# Patient Record
Sex: Male | Born: 1980 | Race: White | Hispanic: No | Marital: Married | Smoking: Never smoker
Health system: Southern US, Community
[De-identification: ages and names within clinical notes are randomized; demographics above are authoritative.]

## PROBLEM LIST (undated history)

## (undated) DIAGNOSIS — M5416 Radiculopathy, lumbar region: Secondary | ICD-10-CM

## (undated) DIAGNOSIS — Z87442 Personal history of urinary calculi: Secondary | ICD-10-CM

## (undated) DIAGNOSIS — I10 Essential (primary) hypertension: Secondary | ICD-10-CM

## (undated) DIAGNOSIS — F419 Anxiety disorder, unspecified: Secondary | ICD-10-CM

## (undated) DIAGNOSIS — M5116 Intervertebral disc disorders with radiculopathy, lumbar region: Secondary | ICD-10-CM

---

## 2004-10-30 ENCOUNTER — Emergency Department: Payer: Self-pay | Admitting: Emergency Medicine

## 2008-12-09 ENCOUNTER — Emergency Department: Payer: Self-pay | Admitting: Emergency Medicine

## 2010-09-05 ENCOUNTER — Emergency Department: Payer: Self-pay | Admitting: Emergency Medicine

## 2011-08-15 ENCOUNTER — Ambulatory Visit: Payer: Self-pay | Admitting: Internal Medicine

## 2012-05-21 ENCOUNTER — Ambulatory Visit: Payer: Self-pay | Admitting: Family Medicine

## 2012-05-21 LAB — DOT URINE DIP
Blood: NEGATIVE
Glucose,UR: NEGATIVE mg/dL (ref 0–75)
Protein: NEGATIVE
Specific Gravity: 1.015 (ref 1.003–1.030)

## 2012-10-14 ENCOUNTER — Emergency Department: Payer: Self-pay | Admitting: Emergency Medicine

## 2012-10-14 LAB — URINALYSIS, COMPLETE
Bacteria: NONE SEEN
Bilirubin,UR: NEGATIVE
Blood: NEGATIVE
Glucose,UR: NEGATIVE mg/dL (ref 0–75)
Ketone: NEGATIVE
Leukocyte Esterase: NEGATIVE
Nitrite: NEGATIVE
Ph: 6 (ref 4.5–8.0)
Protein: NEGATIVE
RBC,UR: NONE SEEN /HPF (ref 0–5)
Specific Gravity: 1.016 (ref 1.003–1.030)
Squamous Epithelial: NONE SEEN
WBC UR: <1 /HPF (ref 0–5)

## 2012-10-14 LAB — COMPREHENSIVE METABOLIC PANEL
Albumin: 4.1 g/dL (ref 3.4–5.0)
Alkaline Phosphatase: 77 U/L (ref 50–136)
Anion Gap: 1 — ABNORMAL LOW (ref 7–16)
BUN: 11 mg/dL (ref 7–18)
Bilirubin,Total: 0.4 mg/dL (ref 0.2–1.0)
Calcium, Total: 9 mg/dL (ref 8.5–10.1)
Chloride: 107 mmol/L (ref 98–107)
Co2: 30 mmol/L (ref 21–32)
Creatinine: 1.02 mg/dL (ref 0.60–1.30)
EGFR (African American): >60
EGFR (Non-African Amer.): >60
Glucose: 92 mg/dL (ref 65–99)
Osmolality: 275 (ref 275–301)
Potassium: 4 mmol/L (ref 3.5–5.1)
SGOT(AST): 77 U/L — ABNORMAL HIGH (ref 15–37)
SGPT (ALT): 152 U/L — ABNORMAL HIGH (ref 12–78)
Sodium: 138 mmol/L (ref 136–145)
Total Protein: 7.7 g/dL (ref 6.4–8.2)

## 2012-10-14 LAB — CBC
HCT: 43.5 % (ref 40.0–52.0)
HGB: 15.1 g/dL (ref 13.0–18.0)
MCH: 27.9 pg (ref 26.0–34.0)
MCHC: 34.7 g/dL (ref 32.0–36.0)
MCV: 81 fL (ref 80–100)
Platelet: 231 10*3/uL (ref 150–440)
RBC: 5.4 10*6/uL (ref 4.40–5.90)
RDW: 14.4 % (ref 11.5–14.5)
WBC: 7.6 10*3/uL (ref 3.8–10.6)

## 2012-10-14 LAB — SEDIMENTATION RATE: Erythrocyte Sed Rate: 1 mm/hr (ref 0–15)

## 2016-03-23 ENCOUNTER — Ambulatory Visit: Payer: 59 | Attending: Internal Medicine

## 2016-03-23 DIAGNOSIS — G4733 Obstructive sleep apnea (adult) (pediatric): Secondary | ICD-10-CM | POA: Insufficient documentation

## 2016-03-24 DIAGNOSIS — I10 Essential (primary) hypertension: Secondary | ICD-10-CM | POA: Diagnosis not present

## 2016-03-24 DIAGNOSIS — R7989 Other specified abnormal findings of blood chemistry: Secondary | ICD-10-CM | POA: Diagnosis not present

## 2016-04-10 DIAGNOSIS — R945 Abnormal results of liver function studies: Secondary | ICD-10-CM | POA: Diagnosis not present

## 2016-05-23 DIAGNOSIS — J042 Acute laryngotracheitis: Secondary | ICD-10-CM | POA: Diagnosis not present

## 2016-05-25 DIAGNOSIS — G4733 Obstructive sleep apnea (adult) (pediatric): Secondary | ICD-10-CM | POA: Diagnosis not present

## 2016-06-25 DIAGNOSIS — G4733 Obstructive sleep apnea (adult) (pediatric): Secondary | ICD-10-CM | POA: Diagnosis not present

## 2016-07-03 DIAGNOSIS — G4733 Obstructive sleep apnea (adult) (pediatric): Secondary | ICD-10-CM | POA: Diagnosis not present

## 2016-07-25 DIAGNOSIS — G4733 Obstructive sleep apnea (adult) (pediatric): Secondary | ICD-10-CM | POA: Diagnosis not present

## 2016-08-17 ENCOUNTER — Encounter: Payer: Self-pay | Admitting: Emergency Medicine

## 2016-08-17 ENCOUNTER — Emergency Department: Payer: 59

## 2016-08-17 ENCOUNTER — Emergency Department
Admission: EM | Admit: 2016-08-17 | Discharge: 2016-08-17 | Disposition: A | Discharge status: Other Acute Inpatient Hospital | Payer: 59 | Attending: Emergency Medicine | Admitting: Emergency Medicine

## 2016-08-17 DIAGNOSIS — R42 Dizziness and giddiness: Secondary | ICD-10-CM | POA: Diagnosis not present

## 2016-08-17 DIAGNOSIS — I671 Cerebral aneurysm, nonruptured: Secondary | ICD-10-CM | POA: Diagnosis not present

## 2016-08-17 DIAGNOSIS — I10 Essential (primary) hypertension: Secondary | ICD-10-CM | POA: Insufficient documentation

## 2016-08-17 DIAGNOSIS — Z79899 Other long term (current) drug therapy: Secondary | ICD-10-CM | POA: Diagnosis not present

## 2016-08-17 DIAGNOSIS — I729 Aneurysm of unspecified site: Secondary | ICD-10-CM | POA: Diagnosis not present

## 2016-08-17 DIAGNOSIS — Z5181 Encounter for therapeutic drug level monitoring: Secondary | ICD-10-CM | POA: Diagnosis not present

## 2016-08-17 DIAGNOSIS — H538 Other visual disturbances: Secondary | ICD-10-CM | POA: Diagnosis not present

## 2016-08-17 DIAGNOSIS — Z9114 Patient's other noncompliance with medication regimen: Secondary | ICD-10-CM | POA: Diagnosis not present

## 2016-08-17 DIAGNOSIS — R51 Headache: Secondary | ICD-10-CM | POA: Diagnosis not present

## 2016-08-17 DIAGNOSIS — R9431 Abnormal electrocardiogram [ECG] [EKG]: Secondary | ICD-10-CM | POA: Diagnosis not present

## 2016-08-17 DIAGNOSIS — R110 Nausea: Secondary | ICD-10-CM | POA: Diagnosis not present

## 2016-08-17 HISTORY — DX: Essential (primary) hypertension: I10

## 2016-08-17 HISTORY — DX: Anxiety disorder, unspecified: F41.9

## 2016-08-17 LAB — COMPREHENSIVE METABOLIC PANEL
ALT: 55 U/L (ref 17–63)
AST: 35 U/L (ref 15–41)
Albumin: 4.5 g/dL (ref 3.5–5.0)
Alkaline Phosphatase: 79 U/L (ref 38–126)
Anion gap: 7 (ref 5–15)
BUN: 13 mg/dL (ref 6–20)
CO2: 27 mmol/L (ref 22–32)
Calcium: 9 mg/dL (ref 8.9–10.3)
Chloride: 105 mmol/L (ref 101–111)
Creatinine, Ser: 0.98 mg/dL (ref 0.61–1.24)
GFR calc Af Amer: >60 mL/min (ref >60)
GFR calc non Af Amer: >60 mL/min (ref >60)
Glucose, Bld: 133 mg/dL — ABNORMAL HIGH (ref 65–99)
Potassium: 3.1 mmol/L — ABNORMAL LOW (ref 3.5–5.1)
Sodium: 139 mmol/L (ref 135–145)
Total Bilirubin: 0.5 mg/dL (ref 0.3–1.2)
Total Protein: 7.8 g/dL (ref 6.5–8.1)

## 2016-08-17 LAB — URINE DRUG SCREEN, QUALITATIVE (ARMC ONLY)
Amphetamines, Ur Screen: NOT DETECTED
Barbiturates, Ur Screen: NOT DETECTED
Benzodiazepine, Ur Scrn: NOT DETECTED
Cannabinoid 50 Ng, Ur ~~LOC~~: NOT DETECTED
Cocaine Metabolite,Ur ~~LOC~~: NOT DETECTED
MDMA (Ecstasy)Ur Screen: NOT DETECTED
Methadone Scn, Ur: NOT DETECTED
Opiate, Ur Screen: NOT DETECTED
Phencyclidine (PCP) Ur S: NOT DETECTED
Tricyclic, Ur Screen: NOT DETECTED

## 2016-08-17 LAB — CBC
HCT: 42.9 % (ref 40.0–52.0)
Hemoglobin: 14.9 g/dL (ref 13.0–18.0)
MCH: 27.9 pg (ref 26.0–34.0)
MCHC: 34.8 g/dL (ref 32.0–36.0)
MCV: 80.2 fL (ref 80.0–100.0)
Platelets: 262 10*3/uL (ref 150–440)
RBC: 5.35 MIL/uL (ref 4.40–5.90)
RDW: 14 % (ref 11.5–14.5)
WBC: 9.7 10*3/uL (ref 3.8–10.6)

## 2016-08-17 LAB — URINALYSIS, COMPLETE (UACMP) WITH MICROSCOPIC
Bilirubin Urine: NEGATIVE
Glucose, UA: NEGATIVE mg/dL
Hgb urine dipstick: NEGATIVE
Ketones, ur: NEGATIVE mg/dL
Leukocytes, UA: NEGATIVE
Nitrite: NEGATIVE
Protein, ur: NEGATIVE mg/dL
Specific Gravity, Urine: 1.02 (ref 1.005–1.030)
Squamous Epithelial / LPF: NONE SEEN
WBC, UA: NONE SEEN WBC/hpf (ref 0–5)
pH: 7 (ref 5.0–8.0)

## 2016-08-17 LAB — TROPONIN I: Troponin I: <0.03 ng/mL (ref <0.03)

## 2016-08-17 LAB — LIPASE, BLOOD: Lipase: 27 U/L (ref 11–51)

## 2016-08-17 MED ORDER — MORPHINE SULFATE (PF) 4 MG/ML IV SOLN
4.0000 mg | Freq: Once | INTRAVENOUS | Status: AC
  Administered 2016-08-17: 4 mg via INTRAVENOUS

## 2016-08-17 MED ORDER — ONDANSETRON HCL 4 MG/2ML IJ SOLN
INTRAMUSCULAR | Status: AC
  Administered 2016-08-17: 4 mg via INTRAVENOUS
  Filled 2016-08-17: qty 2

## 2016-08-17 MED ORDER — IOPAMIDOL (ISOVUE-370) INJECTION 76%
75.0000 mL | Freq: Once | INTRAVENOUS | Status: AC | PRN
Start: 2016-08-17 — End: 2016-08-17
  Administered 2016-08-17: 75 mL via INTRAVENOUS

## 2016-08-17 MED ORDER — ONDANSETRON HCL 4 MG/2ML IJ SOLN
4.0000 mg | Freq: Once | INTRAMUSCULAR | Status: AC
  Administered 2016-08-17: 4 mg via INTRAVENOUS

## 2016-08-17 MED ORDER — MORPHINE SULFATE (PF) 4 MG/ML IV SOLN
INTRAVENOUS | Status: AC
  Filled 2016-08-17: qty 1

## 2016-08-17 MED ORDER — LORAZEPAM 2 MG/ML IJ SOLN
1.0000 mg | Freq: Once | INTRAMUSCULAR | Status: AC
  Administered 2016-08-17: 1 mg via INTRAVENOUS

## 2016-08-17 MED ORDER — ONDANSETRON HCL 4 MG/2ML IJ SOLN
INTRAMUSCULAR | Status: AC
  Filled 2016-08-17: qty 2

## 2016-08-17 MED ORDER — DIPHENHYDRAMINE HCL 50 MG/ML IJ SOLN
INTRAMUSCULAR | Status: AC
  Filled 2016-08-17: qty 1

## 2016-08-17 MED ORDER — LORAZEPAM 2 MG/ML IJ SOLN
INTRAMUSCULAR | Status: AC
  Administered 2016-08-17: 1 mg via INTRAVENOUS
  Filled 2016-08-17: qty 1

## 2016-08-17 NOTE — ED Provider Notes (Signed)
[facility] Emergency Department Provider Note    First MD Initiated Contact with Patient 08/17/16 [phone]     (approximate)  I have reviewed the triage vital signs and the nursing notes.   HISTORY  Chief Complaint Migraine    HPI George Richards is a 36 y.o. male with history of hypertension, anxiety and migraine headaches presents with acute onset of occipital headache with blurred vision and dizziness tonight while having sexual intercourse. Patient states that pain sharp 10 out of 10 radiating from the occiput to the frontal portion of his head. Patient denied any weakness numbness or gait instability. Patient does however admit to nausea and vomiting. Patient states similar episode occurred last week during defecation. Patient states last migraine headache was approximately 8 years ago and that this headache is remarkably different from any previous headaches. Patient states his current pain score is still 10 out of 10. Patient states blurred vision still persists along with dizziness and nausea.   Past Medical History:  Diagnosis Date  . Anxiety   . Hypertension     There are no active problems to display for this patient.   Past surgical history None  Prior to Admission medications   Not on File    Allergies No known drug allergies  Family History None  Social History Social History  Substance Use Topics  . Smoking status: Never Smoker  . Smokeless tobacco: Never Used  . Alcohol use No    Review of Systems Constitutional: No fever/chills Eyes: No visual changes. ENT: No sore throat. Cardiovascular: Denies chest pain. Respiratory: Denies shortness of breath. Gastrointestinal: No abdominal pain. Positive for nausea and vomiting .  No diarrhea.  No constipation. Genitourinary: Negative for dysuria. Musculoskeletal: Negative for neck pain.  Negative for back pain. Integumentary: Negative for rash. Neurological: Positive for headaches,  Negative for focal weakness or numbness.   ____________________________________________   PHYSICAL EXAM:  VITAL SIGNS: ED Triage Vitals  Enc Vitals Group     BP 08/17/16 0118 (!) 169/118     Pulse Rate 08/17/16 0118 80     Resp 08/17/16 0118 18     Temp 08/17/16 0118 98.1 F (36.7 C)     Temp Source 08/17/16 0118 Oral     SpO2 08/17/16 0118 98 %     Weight 08/17/16 0119 122.5 kg (270 lb)     Height 08/17/16 0119 1.778 m (5\' 10" )     Head Circumference --      Peak Flow --      Pain Score 08/17/16 0118 10     Pain Loc --      Pain Edu? --      Excl. in GC? --     Constitutional: Alert and oriented. Apparent discomfort Eyes: Conjunctivae are normal. PERRL. EOMI. Head: Atraumatic. Nose: No congestion/rhinnorhea. Mouth/Throat: Mucous membranes are moist. Oropharynx non-erythematous. Neck: No stridor.   Cardiovascular: Normal rate, regular rhythm. Good peripheral circulation. Grossly normal heart sounds. Respiratory: Normal respiratory effort.  No retractions. Lungs CTAB. Gastrointestinal: Soft and nontender. No distention.  Musculoskeletal: No lower extremity tenderness nor edema. No gross deformities of extremities. Neurologic:  Normal speech and language. No gross focal neurologic deficits are appreciated.  Skin:  Skin is warm, dry and intact. No rash noted. Psychiatric: Mood and affect are normal. Speech and behavior are normal.  ____________________________________________   LABS (all labs ordered are listed, but only abnormal results are displayed)  Labs Reviewed  COMPREHENSIVE METABOLIC PANEL -  Abnormal; Notable for the following:       Result Value   Potassium 3.1 (*)    Glucose, Bld 133 (*)    All other components within normal limits  URINALYSIS, COMPLETE (UACMP) WITH MICROSCOPIC - Abnormal; Notable for the following:    Bacteria, UA RARE (*)    All other components within normal limits  LIPASE, BLOOD  CBC  TROPONIN I  URINE DRUG SCREEN, QUALITATIVE  ([facility] ONLY)     RADIOLOGY I, [place] BROWN, personally viewed and evaluated these images (plain radiographs) as part of my medical decision making, as well as reviewing the written report by the radiologist.  Ct Angio Head W Or Wo Contrast  Result Date: 08/17/2016 CLINICAL DATA:  Initial evaluation for acute onset headache EXAM: CT ANGIOGRAPHY HEAD AND NECK TECHNIQUE: Multidetector CT imaging of the head and neck was performed using the standard protocol during bolus administration of intravenous contrast. Multiplanar CT image reconstructions and MIPs were obtained to evaluate the vascular anatomy. Carotid stenosis measurements (when applicable) are obtained utilizing NASCET criteria, using the distal internal carotid diameter as the denominator. CONTRAST:  75 cc of Isovue 370. COMPARISON:  Prior CT priors prior CT from earlier the same day. FINDINGS: CTA NECK FINDINGS Aortic arch: Visualized aortic arch of normal caliber with normal branch pattern. No flow-limiting stenosis about the origin the great vessels. Left subclavian artery tortuous proximally. Subclavian artery is widely patent. Right carotid system: Right common and internal carotid arteries are widely patent without stenosis, dissection, or occlusion. No significant atheromatous narrowing about the right carotid bifurcation. Left carotid system: Left common and internal carotid arteries are widely patent without stenosis, dissection, or occlusion. No significant atheromatous narrowing about the left carotid bifurcation. Vertebral arteries: Both of the vertebral arteries arise from the subclavian arteries. Right vertebral artery dominant. Left vertebral artery diffusely hypoplastic. Vertebral arteries widely patent without stenosis, dissection, or occlusion. Skeleton: No acute osseus abnormality. No worrisome lytic or blastic osseous lesions. Other neck: No acute soft tissue abnormality within the neck. Salivary glands normal. Thyroid normal.  No adenopathy. Upper chest: Visualized upper chest within normal limits. Partially visualized lungs are clear. Review of the MIP images confirms the above findings CTA HEAD FINDINGS Anterior circulation: Petrous, cavernous, supraclinoid segments of the internal carotid arteries are widely patent without stenosis. ICA termini widely patent. A1 segments patent bilaterally. Anterior communicating artery normal. Anterior cerebral artery is widely patent to their distal aspects. M1 segments widely patent without stenosis or occlusion. MCA bifurcations normal. No proximal M2 occlusion. Distal MCA branches well opacified and symmetric. Posterior circulation: Dominant right vertebral artery widely patent to the vertebrobasilar junction. Left vertebral artery diffusely hypoplastic and patent as well. Posterior inferior cerebral arteries patent bilaterally. Basilar artery widely patent. Superior cerebellar is patent bilaterally. PCAs supplied via the basilar artery bilaterally. Right PCA normal in appearance and well supply to its distal aspect. There is fusiform dilatation of the left P1 segment up to 3 mm (series 9, image 109). Left PCA widely patent distally and normal in appearance. Venous sinuses: Patent. Anatomic variants: No other significant anatomic variant. No other aneurysm or vascular malformation. Delayed phase: No pathologic enhancement. Review of the MIP images confirms the above findings IMPRESSION: 1. Fusiform dilatation of the left P1 segment up to 3 mm. While it is unclear whether this finding is of clinical significance, neuro endovascular consultation is suggested for possible consideration of follow-up catheter directed arteriogram and/or need for follow-up imaging regarding this finding. 2.  Otherwise normal CTA of the head and neck. Electronically Signed   By: Rise MuBenjamin  McClintock M.D.   On: 08/17/2016 05:45   Ct Head Wo Contrast  Result Date: 08/17/2016 CLINICAL DATA:  Initial evaluation for acute  headache, blurry vision. EXAM: CT HEAD WITHOUT CONTRAST TECHNIQUE: Contiguous axial images were obtained from the base of the skull through the vertex without intravenous contrast. COMPARISON:  None. FINDINGS: Brain: Cerebral volume within normal limits for patient age. No evidence for acute intracranial hemorrhage. No findings to suggest acute large vessel territory infarct. No mass lesion, midline shift, or mass effect. Ventricles are normal in size without evidence for hydrocephalus. No extra-axial fluid collection identified. Vascular: No hyperdense vessel identified. Skull: Scalp soft tissues demonstrate no acute abnormality.Calvarium intact. Sinuses/Orbits: Globes and orbital soft tissues are within normal limits. Visualized paranasal sinuses are clear. No mastoid effusion. IMPRESSION: Normal head CT.  No acute intracranial process identified. Electronically Signed   By: Rise MuBenjamin  McClintock M.D.   On: 08/17/2016 02:01   Ct Angio Neck W And/or Wo Contrast  Result Date: 08/17/2016 CLINICAL DATA:  Initial evaluation for acute onset headache EXAM: CT ANGIOGRAPHY HEAD AND NECK TECHNIQUE: Multidetector CT imaging of the head and neck was performed using the standard protocol during bolus administration of intravenous contrast. Multiplanar CT image reconstructions and MIPs were obtained to evaluate the vascular anatomy. Carotid stenosis measurements (when applicable) are obtained utilizing NASCET criteria, using the distal internal carotid diameter as the denominator. CONTRAST:  75 cc of Isovue 370. COMPARISON:  Prior CT priors prior CT from earlier the same day. FINDINGS: CTA NECK FINDINGS Aortic arch: Visualized aortic arch of normal caliber with normal branch pattern. No flow-limiting stenosis about the origin the great vessels. Left subclavian artery tortuous proximally. Subclavian artery is widely patent. Right carotid system: Right common and internal carotid arteries are widely patent without stenosis,  dissection, or occlusion. No significant atheromatous narrowing about the right carotid bifurcation. Left carotid system: Left common and internal carotid arteries are widely patent without stenosis, dissection, or occlusion. No significant atheromatous narrowing about the left carotid bifurcation. Vertebral arteries: Both of the vertebral arteries arise from the subclavian arteries. Right vertebral artery dominant. Left vertebral artery diffusely hypoplastic. Vertebral arteries widely patent without stenosis, dissection, or occlusion. Skeleton: No acute osseus abnormality. No worrisome lytic or blastic osseous lesions. Other neck: No acute soft tissue abnormality within the neck. Salivary glands normal. Thyroid normal. No adenopathy. Upper chest: Visualized upper chest within normal limits. Partially visualized lungs are clear. Review of the MIP images confirms the above findings CTA HEAD FINDINGS Anterior circulation: Petrous, cavernous, supraclinoid segments of the internal carotid arteries are widely patent without stenosis. ICA termini widely patent. A1 segments patent bilaterally. Anterior communicating artery normal. Anterior cerebral artery is widely patent to their distal aspects. M1 segments widely patent without stenosis or occlusion. MCA bifurcations normal. No proximal M2 occlusion. Distal MCA branches well opacified and symmetric. Posterior circulation: Dominant right vertebral artery widely patent to the vertebrobasilar junction. Left vertebral artery diffusely hypoplastic and patent as well. Posterior inferior cerebral arteries patent bilaterally. Basilar artery widely patent. Superior cerebellar is patent bilaterally. PCAs supplied via the basilar artery bilaterally. Right PCA normal in appearance and well supply to its distal aspect. There is fusiform dilatation of the left P1 segment up to 3 mm (series 9, image 109). Left PCA widely patent distally and normal in appearance. Venous sinuses: Patent.  Anatomic variants: No other significant anatomic variant. No other aneurysm or  vascular malformation. Delayed phase: No pathologic enhancement. Review of the MIP images confirms the above findings IMPRESSION: 1. Fusiform dilatation of the left P1 segment up to 3 mm. While it is unclear whether this finding is of clinical significance, neuro endovascular consultation is suggested for possible consideration of follow-up catheter directed arteriogram and/or need for follow-up imaging regarding this finding. 2. Otherwise normal CTA of the head and neck. Electronically Signed   By: Rise Mu M.D.   On: 08/17/2016 05:45    ____________________________________________   PROCEDURES  Critical Care performed: CRITICAL CARE Performed by: Darci Current   Total critical care time: 30 minutes  Critical care time was exclusive of separately billable procedures and treating other patients.  Critical care was necessary to treat or prevent imminent or life-threatening deterioration.  Critical care was time spent personally by me on the following activities: development of treatment plan with patient and/or surrogate as well as nursing, discussions with consultants, evaluation of patient's response to treatment, examination of patient, obtaining history from patient or surrogate, ordering and performing treatments and interventions, ordering and review of laboratory studies, ordering and review of radiographic studies, pulse oximetry and re-evaluation of patient's condition.   Procedures   ____________________________________________   INITIAL IMPRESSION / ASSESSMENT AND PLAN / ED COURSE  Pertinent labs & imaging results that were available during my care of the patient were reviewed by me and considered in my medical decision making (see chart for details).  36 year old male presenting with abrupt onset of headache during intercourse and last week during defecation. CT angiogram  concerning for fusiform dilation of the left P1 segment. Given this finding patient discussed with Dr. Darlen Round neurosurgeon on call who will accept the patient in transfer to [facility] emergency department for further evaluation and management.  Patient was given IV morphine following evaluation with improvement of pain however pain returned. Patient admits that he has a long-standing history of anxiety and admits to markedly anxiety after being notified of CTA findings. As such patient received 1 mg of IV Ativan    ____________________________________________  FINAL CLINICAL IMPRESSION(S) / ED DIAGNOSES  Final diagnoses:  Intracerebral aneurysm     MEDICATIONS GIVEN DURING THIS VISIT:  Medications  diphenhydrAMINE (BENADRYL) 50 MG/ML injection (not administered)  LORazepam (ATIVAN) injection 1 mg (not administered)  ondansetron (ZOFRAN) injection 4 mg (not administered)  morphine 4 MG/ML injection 4 mg (4 mg Intravenous Given 08/17/16 0414)  ondansetron (ZOFRAN) injection 4 mg (4 mg Intravenous Given 08/17/16 0412)  iopamidol (ISOVUE-370) 76 % injection 75 mL (75 mLs Intravenous Contrast Given 08/17/16 0427)     NEW OUTPATIENT MEDICATIONS STARTED DURING THIS VISIT:  New Prescriptions   No medications on file    Modified Medications   No medications on file    Discontinued Medications   No medications on file     Note:  This document was prepared using Dragon voice recognition software and may include unintentional dictation errors.    Darci Current, MD 08/17/16 [phone]

## 2016-08-17 NOTE — ED Notes (Signed)
Patient transported to CT 

## 2016-08-17 NOTE — ED Triage Notes (Addendum)
Pt ambulatory to triage room with steady gait c/o migraine, dizziness, nausea and vomiting x 1 week. Pt states migraine accompanied by blurry vision. Pt denies abd pain, chest pain, shortness of breath. Pt anxious during triage, requesting to be placed on O2 while EKG is being performed. Pt placed on 2 L [place] for comfort. Pt diaphoretic in triage. Pt has hx of HTN, migraines and anxiety, states took BP med PTA.

## 2016-08-17 NOTE — ED Notes (Signed)
This RN spoke with MD for further orders for patient. Orders placed by MD.

## 2016-08-25 DIAGNOSIS — G4733 Obstructive sleep apnea (adult) (pediatric): Secondary | ICD-10-CM | POA: Diagnosis not present

## 2016-09-06 DIAGNOSIS — Z9989 Dependence on other enabling machines and devices: Secondary | ICD-10-CM | POA: Diagnosis not present

## 2016-09-06 DIAGNOSIS — G4733 Obstructive sleep apnea (adult) (pediatric): Secondary | ICD-10-CM | POA: Diagnosis not present

## 2016-09-06 DIAGNOSIS — Z0001 Encounter for general adult medical examination with abnormal findings: Secondary | ICD-10-CM | POA: Diagnosis not present

## 2016-09-06 DIAGNOSIS — I10 Essential (primary) hypertension: Secondary | ICD-10-CM | POA: Diagnosis not present

## 2016-12-14 ENCOUNTER — Emergency Department
Admission: EM | Admit: 2016-12-14 | Discharge: 2016-12-14 | Disposition: A | Discharge status: Home / Self Care | Payer: 59 | Attending: Emergency Medicine | Admitting: Emergency Medicine

## 2016-12-14 DIAGNOSIS — S0591XA Unspecified injury of right eye and orbit, initial encounter: Secondary | ICD-10-CM | POA: Diagnosis present

## 2016-12-14 DIAGNOSIS — X58XXXA Exposure to other specified factors, initial encounter: Secondary | ICD-10-CM | POA: Insufficient documentation

## 2016-12-14 DIAGNOSIS — Y929 Unspecified place or not applicable: Secondary | ICD-10-CM | POA: Diagnosis not present

## 2016-12-14 DIAGNOSIS — I10 Essential (primary) hypertension: Secondary | ICD-10-CM | POA: Insufficient documentation

## 2016-12-14 DIAGNOSIS — Y939 Activity, unspecified: Secondary | ICD-10-CM | POA: Insufficient documentation

## 2016-12-14 DIAGNOSIS — Y999 Unspecified external cause status: Secondary | ICD-10-CM | POA: Insufficient documentation

## 2016-12-14 DIAGNOSIS — S0501XA Injury of conjunctiva and corneal abrasion without foreign body, right eye, initial encounter: Secondary | ICD-10-CM | POA: Diagnosis not present

## 2016-12-14 MED ORDER — POLYMYXIN B-TRIMETHOPRIM 10000-0.1 UNIT/ML-% OP SOLN: 2.0000 [drp] | Freq: Four times a day (QID) | OPHTHALMIC | 0 refills | Status: DC

## 2016-12-14 MED ORDER — TETRACAINE HCL 0.5 % OP SOLN
2.0000 [drp] | Freq: Once | OPHTHALMIC | Status: AC
  Administered 2016-12-14: 2 [drp] via OPHTHALMIC
  Filled 2016-12-14: qty 4

## 2016-12-14 MED ORDER — KETOROLAC TROMETHAMINE 0.5 % OP SOLN: 1.0000 [drp] | Freq: Four times a day (QID) | OPHTHALMIC | 0 refills | Status: DC

## 2016-12-14 MED ORDER — FLUORESCEIN SODIUM 1 MG OP STRP
1.0000 | ORAL_STRIP | Freq: Once | OPHTHALMIC | Status: AC
  Administered 2016-12-14: 1 via OPHTHALMIC
  Filled 2016-12-14: qty 1

## 2016-12-14 NOTE — ED Triage Notes (Signed)
Patient reports he believes he got metal in his right eye while working in the garage 2 hours ago. Patient c/o eye pain and blurred vision.

## 2016-12-14 NOTE — ED Provider Notes (Signed)
[facility] Emergency Department Provider Note  ____________________________________________  Time seen: Approximately 11:19 PM  I have reviewed the triage vital signs and the nursing notes.   HISTORY  Chief Complaint Eye Injury (right)    HPI George Richards is a 36 y.o. male who presents emergency department complaining of foreign body to the right eye. Patient reports it is working on a vehicle when he felt a piece of metal fly into his eye. Patient continues to have a foreign body sensation. His eyes erythematous and swollen. Patient has had continual clear drainage from his eye. No other injury or complaint. No medications prior to arrival. Patient does not wear glasses or contacts.   Past Medical History:  Diagnosis Date  . Anxiety   . Hypertension     There are no active problems to display for this patient.   History reviewed. No pertinent surgical history.  Prior to Admission medications   Medication Sig Start Date End Date Taking? Authorizing Provider  ketorolac (ACULAR) 0.5 % ophthalmic solution Place 1 drop into the right eye 4 (four) times daily. 12/14/16   Briselda Naval, Delorise Royals, PA-C  trimethoprim-polymyxin b (POLYTRIM) ophthalmic solution Place 2 drops into the right eye every 6 (six) hours. 12/14/16   Zayvian Mcmurtry, Delorise Royals, PA-C    Allergies Patient has no known allergies.  No family history on file.  Social History Social History  Substance Use Topics  . Smoking status: Never Smoker  . Smokeless tobacco: Never Used  . Alcohol use No     Review of Systems  Constitutional: No fever/chills Eyes: No visual changes. No discharge. Positive for foreign body sensation to the R eye ENT: No upper respiratory complaints. Cardiovascular: no chest pain. Respiratory: no cough. No SOB. Gastrointestinal: No abdominal pain.  No nausea, no vomiting.  Musculoskeletal: Negative for musculoskeletal pain. Skin: Negative for rash, abrasions,  lacerations, ecchymosis. Neurological: Negative for headaches, focal weakness or numbness. 10-point ROS otherwise negative.  ____________________________________________   PHYSICAL EXAM:  VITAL SIGNS: ED Triage Vitals  Enc Vitals Group     BP 12/14/16 2131 (!) 145/98     Pulse Rate 12/14/16 2131 90     Resp 12/14/16 2131 18     Temp 12/14/16 2131 98.7 F (37.1 C)     Temp Source 12/14/16 2131 Oral     SpO2 12/14/16 2131 97 %     Weight 12/14/16 2128 265 lb (120.2 kg)     Height 12/14/16 2128 5\' 10"  (1.778 m)     Head Circumference --      Peak Flow --      Pain Score 12/14/16 2128 8     Pain Loc --      Pain Edu? --      Excl. in GC? --      Constitutional: Alert and oriented. Well appearing and in no acute distress. Eyes: conjunctiva on right is erythematous. PERRL. EOMI.unduscopic exam reveals no visible foreign body, red reflex, vasculature, optic disc unremarkable. Eyes anesthetized using tetracaine. Fluorescein staining is applied with area of uptake in the 7:00 position over the iris. No foreign body. Head: Atraumatic. ENT:      Ears:       Nose: No congestion/rhinnorhea.      Mouth/Throat: Mucous membranes are moist.  Neck: No stridor.    Cardiovascular: Normal rate, regular rhythm. Normal S1 and S2.  Good peripheral circulation. Respiratory: Normal respiratory effort without tachypnea or retractions. Lungs CTAB. Good air entry to the  bases with no decreased or absent breath sounds. Musculoskeletal: Full range of motion to all extremities. No gross deformities appreciated. Neurologic:  Normal speech and language. No gross focal neurologic deficits are appreciated.  Skin:  Skin is warm, dry and intact. No rash noted. Psychiatric: Mood and affect are normal. Speech and behavior are normal. Patient exhibits appropriate insight and judgement.   ____________________________________________   LABS (all labs ordered are listed, but only abnormal results are  displayed)  Labs Reviewed - No data to display ____________________________________________  EKG   ____________________________________________  RADIOLOGY   No results found.  ____________________________________________    PROCEDURES  Procedure(s) performed:    Procedures    Medications  tetracaine (PONTOCAINE) 0.5 % ophthalmic solution 2 drop (2 drops Right Eye Given 12/14/16 2344)  fluorescein ophthalmic strip 1 strip (1 strip Right Eye Given 12/14/16 2344)     ____________________________________________   INITIAL IMPRESSION / ASSESSMENT AND PLAN / ED COURSE  Pertinent labs & imaging results that were available during my care of the patient were reviewed by me and considered in my medical decision making (see chart for details).  Review of the [place] CSRS was performed in accordance of the [facility] prior to dispensing any controlled drugs.     Patient's diagnosis is consistent with corneal abrasion. Differential included foreign body, corneal abrasion, globe rupture, conjunctivitis. At this time, no evidence of remaining foreign body. Punctate lesion over the iris is consistent with injury from foreign body,however no residual.. Patient will be discharged home with prescriptions for Polytrim eye drops and Acular for symptom control. Patient is to follow up with ophthalmology as needed or otherwise directed. Patient is given ED precautions to return to the ED for any worsening or new symptoms.     ____________________________________________  FINAL CLINICAL IMPRESSION(S) / ED DIAGNOSES  Final diagnoses:  Abrasion of right cornea, initial encounter      NEW MEDICATIONS STARTED DURING THIS VISIT:  Discharge Medication List as of 12/14/2016 11:43 PM    START taking these medications   Details  ketorolac (ACULAR) 0.5 % ophthalmic solution Place 1 drop into the right eye 4 (four) times daily., Starting Thu 12/14/2016, Print    trimethoprim-polymyxin b  (POLYTRIM) ophthalmic solution Place 2 drops into the right eye every 6 (six) hours., Starting Thu 12/14/2016, Print            This chart was dictated using voice recognition software/Dragon. Despite best efforts to proofread, errors can occur which can change the meaning. Any change was purely unintentional.    Racheal PatchesCuthriell, Tyannah Sane D, PA-C 12/15/16 0012    Jeanmarie PlantMcShane, James A, MD 12/15/16 2113

## 2017-03-14 DIAGNOSIS — R5383 Other fatigue: Secondary | ICD-10-CM | POA: Diagnosis not present

## 2018-04-02 ENCOUNTER — Emergency Department
Admission: EM | Admit: 2018-04-02 | Discharge: 2018-04-02 | Disposition: A | Discharge status: Home / Self Care | Payer: Self-pay | Attending: Emergency Medicine | Admitting: Emergency Medicine

## 2018-04-02 ENCOUNTER — Encounter: Payer: Self-pay | Admitting: *Deleted

## 2018-04-02 ENCOUNTER — Emergency Department: Payer: Self-pay

## 2018-04-02 DIAGNOSIS — M5432 Sciatica, left side: Secondary | ICD-10-CM | POA: Insufficient documentation

## 2018-04-02 DIAGNOSIS — F419 Anxiety disorder, unspecified: Secondary | ICD-10-CM | POA: Insufficient documentation

## 2018-04-02 DIAGNOSIS — R030 Elevated blood-pressure reading, without diagnosis of hypertension: Secondary | ICD-10-CM

## 2018-04-02 DIAGNOSIS — Z79899 Other long term (current) drug therapy: Secondary | ICD-10-CM | POA: Insufficient documentation

## 2018-04-02 DIAGNOSIS — I10 Essential (primary) hypertension: Secondary | ICD-10-CM | POA: Insufficient documentation

## 2018-04-02 LAB — URINALYSIS, COMPLETE (UACMP) WITH MICROSCOPIC
Bacteria, UA: NONE SEEN
Bilirubin Urine: NEGATIVE
Glucose, UA: NEGATIVE mg/dL
Hgb urine dipstick: NEGATIVE
Ketones, ur: NEGATIVE mg/dL
Leukocytes, UA: NEGATIVE
Nitrite: NEGATIVE
Protein, ur: NEGATIVE mg/dL
Specific Gravity, Urine: 1.017 (ref 1.005–1.030)
Squamous Epithelial / LPF: NONE SEEN (ref 0–5)
pH: 6 (ref 5.0–8.0)

## 2018-04-02 MED ORDER — IBUPROFEN 600 MG PO TABS: 600.0000 mg | ORAL_TABLET | Freq: Four times a day (QID) | ORAL | 0 refills | Status: DC | PRN

## 2018-04-02 MED ORDER — LIDOCAINE 5 % EX PTCH
1.0000 | MEDICATED_PATCH | CUTANEOUS | Status: DC
  Administered 2018-04-02: 1 via TRANSDERMAL
  Filled 2018-04-02: qty 1

## 2018-04-02 MED ORDER — CYCLOBENZAPRINE HCL 5 MG PO TABS: ORAL_TABLET | ORAL | 0 refills | Status: DC

## 2018-04-02 MED ORDER — ORPHENADRINE CITRATE 30 MG/ML IJ SOLN
60.0000 mg | Freq: Two times a day (BID) | INTRAMUSCULAR | Status: DC
  Administered 2018-04-02: 60 mg via INTRAMUSCULAR
  Filled 2018-04-02: qty 2

## 2018-04-02 MED ORDER — OXYCODONE-ACETAMINOPHEN 5-325 MG PO TABS: 1.0000 | ORAL_TABLET | ORAL | 0 refills | Status: DC | PRN

## 2018-04-02 MED ORDER — LIDOCAINE 5 % EX PTCH: 1.0000 | MEDICATED_PATCH | CUTANEOUS | 0 refills | Status: DC

## 2018-04-02 MED ORDER — OXYCODONE-ACETAMINOPHEN 5-325 MG PO TABS
1.0000 | ORAL_TABLET | Freq: Once | ORAL | Status: AC
  Administered 2018-04-02: 1 via ORAL
  Filled 2018-04-02: qty 1

## 2018-04-02 MED ORDER — KETOROLAC TROMETHAMINE 30 MG/ML IJ SOLN
30.0000 mg | Freq: Once | INTRAMUSCULAR | Status: AC
  Administered 2018-04-02: 30 mg via INTRAMUSCULAR
  Filled 2018-04-02: qty 1

## 2018-04-02 NOTE — ED Triage Notes (Addendum)
Pt is here for lower left lower back pain with radiation into left thigh.  Pt states that the pain is most severe in left buttock.  Pt states that he was incontinent of urine x1.  Pain most severe when changing position.

## 2018-04-02 NOTE — ED Provider Notes (Signed)
[facility] Emergency Department Provider Note  ____________________________________________  Time seen: Approximately 11:36 AM  I have reviewed the triage vital signs and the nursing notes.   HISTORY  Chief Complaint Sciatica    HPI George Richards is a 38 y.o. male that presents to the emergency department for evaluation of low back pain for 2 days.  Pain is primarily to his left buttocks.  He states that it is painful trying to lift his right leg.  He had to crawl out of his truck yesterday due to pain.  Patient states that pain began while he was riding in the car yesterday.  He had an episode of incontinence where he states that he had difficulty controlling his urination.  He states that he could tell it was coming and was able to grab a bottle while he was driving.  He states that when the bottle was entirely full of urine, he was unable to stop the stream.  He states that he also urinated several times on his drive from [place] to [place]. He was able to control these episodes of urination.  Patient states that he has felt some on and off tingling to anterior, posterior thighs and his testicles.  No trauma.  No history of back pain.  Patient states that he used to take blood pressure medication but self discontinued medication 6 months ago.    Past Medical History:  Diagnosis Date  . Anxiety   . Hypertension     There are no active problems to display for this patient.   History reviewed. No pertinent surgical history.  Prior to Admission medications   Medication Sig Start Date End Date Taking? Authorizing Provider  amLODipine (NORVASC) 10 MG tablet Take 10 mg by mouth daily.   Yes [provider]  clonazePAM (KLONOPIN) 1 MG tablet Take 1 mg by mouth 2 (two) times daily.   Yes [provider]  cyclobenzaprine (FLEXERIL) 5 MG tablet Take 1-2 tablets 3 times daily as needed 04/02/18   Enid DerryWagner, Ronesha Heenan, PA-C  ibuprofen (ADVIL,MOTRIN) 600  MG tablet Take 1 tablet (600 mg total) by mouth every 6 (six) hours as needed. 04/02/18   Enid DerryWagner, Samyia Motter, PA-C  ketorolac (ACULAR) 0.5 % ophthalmic solution Place 1 drop into the right eye 4 (four) times daily. 12/14/16   Cuthriell, Delorise RoyalsJonathan D, PA-C  lidocaine (LIDODERM) 5 % Place 1 patch onto the skin daily. Remove & Discard patch within 12 hours or as directed by MD 04/02/18   Enid DerryWagner, Yamilka Lopiccolo, PA-C  oxyCODONE-acetaminophen (PERCOCET) 5-325 MG tablet Take 1 tablet by mouth every 4 (four) hours as needed for severe pain. 04/02/18 04/02/19  Enid DerryWagner, Jamileth Putzier, PA-C  trimethoprim-polymyxin b (POLYTRIM) ophthalmic solution Place 2 drops into the right eye every 6 (six) hours. 12/14/16   Cuthriell, Delorise RoyalsJonathan D, PA-C    Allergies Patient has no known allergies.  No family history on file.  Social History Social History   Tobacco Use  . Smoking status: Never Smoker  . Smokeless tobacco: Never Used  Substance Use Topics  . Alcohol use: No  . Drug use: No     Review of Systems  Constitutional: No fever/chills ENT: No upper respiratory complaints. Cardiovascular: No chest pain. Respiratory:  No SOB. Gastrointestinal: No abdominal pain.  No nausea, no vomiting.  Musculoskeletal: Positive for back pain.  Skin: Negative for rash, abrasions, lacerations, ecchymosis. Neurological: Negative for headaches   ____________________________________________   PHYSICAL EXAM:  VITAL SIGNS: ED Triage Vitals  Enc Vitals  Group     BP 04/02/18 1003 (!) 157/123     Pulse Rate 04/02/18 1003 88     Resp 04/02/18 1003 16     Temp 04/02/18 1003 98.4 F (36.9 C)     Temp Source 04/02/18 1003 Oral     SpO2 04/02/18 1003 96 %     Weight 04/02/18 1006 260 lb (117.9 kg)     Height 04/02/18 1006 5\' 10"  (1.778 m)     Head Circumference --      Peak Flow --      Pain Score 04/02/18 1003 8     Pain Loc --      Pain Edu? --      Excl. in GC? --      Constitutional: Alert and oriented. Well appearing and in no  acute distress.  Patient laying comfortably on bed. Eyes: Conjunctivae are normal. PERRL. EOMI. Head: Atraumatic. ENT:      Ears:      Nose: No congestion/rhinnorhea.      Mouth/Throat: Mucous membranes are moist.  Neck: No stridor.  Cardiovascular: Normal rate, regular rhythm.  Good peripheral circulation. Respiratory: Normal respiratory effort without tachypnea or retractions. Lungs CTAB. Good air entry to the bases with no decreased or absent breath sounds. Gastrointestinal: Bowel sounds 4 quadrants. Soft and nontender to palpation. No guarding or rigidity. No palpable masses. No distention.  Musculoskeletal: Full range of motion to all extremities. No gross deformities appreciated.  Tenderness to palpation to left buttocks.  No tenderness to palpation over lumbar spine.  Strength equal in lower extremities bilaterally.  Full range of motion of bilateral hips. Neurologic:  Normal speech and language. No gross focal neurologic deficits are appreciated.  Skin:  Skin is warm, dry and intact. No rash noted. Psychiatric: Mood and affect are normal. Speech and behavior are normal. Patient exhibits appropriate insight and judgement.   ____________________________________________   LABS (all labs ordered are listed, but only abnormal results are displayed)  Labs Reviewed  URINALYSIS, COMPLETE (UACMP) WITH MICROSCOPIC - Abnormal; Notable for the following components:      Result Value   Color, Urine YELLOW (*)    APPearance CLEAR (*)    All other components within normal limits   ____________________________________________  EKG   ____________________________________________  RADIOLOGY Lexine Baton, personally viewed and evaluated these images (plain radiographs) as part of my medical decision making, as well as reviewing the written report by the radiologist.  Mr Lumbar Spine Wo Contrast  Result Date: 04/02/2018 CLINICAL DATA:  Acute presentation with low back pain radiating  to the left leg. EXAM: MRI LUMBAR SPINE WITHOUT CONTRAST TECHNIQUE: Multiplanar, multisequence MR imaging of the lumbar spine was performed. No intravenous contrast was administered. COMPARISON:  CT abdomen 09/05/2010 FINDINGS: Segmentation:  5 lumbar type vertebral bodies. Alignment:  Minimal curvature convex to the left. Vertebrae:  No fracture or primary bone lesion. Conus medullaris and cauda equina: Conus extends to the L1 level. Conus and cauda equina appear normal. Paraspinal and other soft tissues: Negative Disc levels: No abnormality at L2-3 or above. L3-4: Mild bulging of the disc. There is mild bilateral foraminal narrowing, but no visible compression of either L3 nerve. Some potential that either L3 nerve could be irritated. L4-5: Normal appearance of the disc. Mild facet hypertrophy. No compressive stenosis. L5-S1: Bulging of the disc slightly more towards the left. Mild facet and ligamentous prominence. Mild narrowing of the subarticular lateral recess on the left but no distinct neural  compression. Left S1 nerve irritation would be possible. IMPRESSION: No advanced, acute appearing or definitely significant finding. L3-4: Biforaminal disc bulges, slightly more prominent on the left, adjacent to the L3 nerves. Definite nerve compression is not established, but nerve irritation could occur. L5-S1: Disc bulge more prominent towards the left, adjacent to the left S1 nerve root in the subarticular lateral recess. Again, definite nerve compression is not established but nerve irritation would be possible. Electronically Signed   By: Paulina Fusi M.D.   On: 04/02/2018 12:43    ____________________________________________    PROCEDURES  Procedure(s) performed:    Procedures    Medications  orphenadrine (NORFLEX) injection 60 mg (60 mg Intramuscular Given 04/02/18 1430)  lidocaine (LIDODERM) 5 % 1 patch (1 patch Transdermal Patch Applied 04/02/18 1430)  oxyCODONE-acetaminophen (PERCOCET/ROXICET)  5-325 MG per tablet 1 tablet (1 tablet Oral Given 04/02/18 1430)  ketorolac (TORADOL) 30 MG/ML injection 30 mg (30 mg Intramuscular Given 04/02/18 1443)     ____________________________________________   INITIAL IMPRESSION / ASSESSMENT AND PLAN / ED COURSE  Pertinent labs & imaging results that were available during my care of the patient were reviewed by me and considered in my medical decision making (see chart for details).  Review of the [place] CSRS was performed in accordance of the [facility] prior to dispensing any controlled drugs.     Patient's diagnosis is consistent with sciatica.  Vital signs and exam are reassuring.  MRI negative for cauda equina.  I suspect that episode of incontinence that patient had while driving yesterday was due to a full bladder since he was able to fill up an entire water bottle of urine and had to keep urinating.  Patient appears comfortable while in the emergency department.  He is sleeping comfortably on his stomach and is able to sit himself up in bed without difficulty.  He was given Percocet, IM Toradol, IM Norflex for pain. Pain improved with medication.  Patient was educated that his blood pressure was elevated when he arrived in the emergency department.  This may be also elevated due to acute pain.  Patient is agreeable to follow-up with primary care for blood pressure recheck and discussion of his blood pressure medications. Blood pressure 150/88 prior to discharge.  Patient will be discharged home with prescriptions for Motrin, Flexeril, Percocet, Lidoderm. Patient is to follow up with primary care and neurosurgery as directed. Patient is given ED precautions to return to the ED for any worsening or new symptoms.     ____________________________________________  FINAL CLINICAL IMPRESSION(S) / ED DIAGNOSES  Final diagnoses:  Sciatica of left side  Elevated blood pressure reading      NEW MEDICATIONS STARTED DURING THIS VISIT:  ED Discharge Orders          Ordered    ibuprofen (ADVIL,MOTRIN) 600 MG tablet  Every 6 hours PRN     04/02/18 1511    cyclobenzaprine (FLEXERIL) 5 MG tablet     04/02/18 1511    oxyCODONE-acetaminophen (PERCOCET) 5-325 MG tablet  Every 4 hours PRN     04/02/18 1511    lidocaine (LIDODERM) 5 %  Every 24 hours     04/02/18 1511              This chart was dictated using voice recognition software/Dragon. Despite best efforts to proofread, errors can occur which can change the meaning. Any change was purely unintentional.    Enid Derry, PA-C 04/02/18 1546    Don Perking, [place], MD 04/03/18  1513  

## 2018-04-02 NOTE — ED Notes (Signed)
Taken to MRI via stretcher 

## 2018-04-02 NOTE — Discharge Instructions (Signed)
Your MRI shows that you have 2 mild buldging discs that may cause irritation to your nerves.  There is no fracture.  Please begin anti-inflammatories, muscle relaxers for symptoms.  You can take Percocet as needed for extreme pain.  Please follow-up with neurosurgery for continued symptoms.  You had an elevated blood pressure reading in the emergency department.  Please follow-up with primary care for recheck.  Return to the emergency department for worsening symptoms.

## 2018-06-25 ENCOUNTER — Emergency Department
Admission: EM | Admit: 2018-06-25 | Discharge: 2018-06-26 | Disposition: A | Discharge status: Home / Self Care | Payer: Self-pay | Attending: Emergency Medicine | Admitting: Emergency Medicine

## 2018-06-25 ENCOUNTER — Other Ambulatory Visit: Payer: Self-pay

## 2018-06-25 ENCOUNTER — Encounter: Payer: Self-pay | Admitting: Emergency Medicine

## 2018-06-25 ENCOUNTER — Emergency Department: Payer: Self-pay

## 2018-06-25 DIAGNOSIS — Z79899 Other long term (current) drug therapy: Secondary | ICD-10-CM | POA: Insufficient documentation

## 2018-06-25 DIAGNOSIS — I10 Essential (primary) hypertension: Secondary | ICD-10-CM | POA: Insufficient documentation

## 2018-06-25 DIAGNOSIS — R109 Unspecified abdominal pain: Secondary | ICD-10-CM

## 2018-06-25 DIAGNOSIS — N132 Hydronephrosis with renal and ureteral calculous obstruction: Secondary | ICD-10-CM | POA: Insufficient documentation

## 2018-06-25 HISTORY — DX: Radiculopathy, lumbar region: M54.16

## 2018-06-25 HISTORY — DX: Personal history of urinary calculi: Z87.442

## 2018-06-25 HISTORY — DX: Intervertebral disc disorders with radiculopathy, lumbar region: M51.16

## 2018-06-25 LAB — URINALYSIS, COMPLETE (UACMP) WITH MICROSCOPIC
Bacteria, UA: NONE SEEN
Bilirubin Urine: NEGATIVE
Glucose, UA: NEGATIVE mg/dL
Ketones, ur: NEGATIVE mg/dL
Leukocytes,Ua: NEGATIVE
Nitrite: NEGATIVE
Protein, ur: NEGATIVE mg/dL
Specific Gravity, Urine: 1.021 (ref 1.005–1.030)
Squamous Epithelial / HPF: NONE SEEN (ref 0–5)
pH: 5 (ref 5.0–8.0)

## 2018-06-25 LAB — CBC WITH DIFFERENTIAL/PLATELET
Abs Immature Granulocytes: 0.02 10*3/uL (ref 0.00–0.07)
Basophils Absolute: 0.1 10*3/uL (ref 0.0–0.1)
Basophils Relative: 1 %
Eosinophils Absolute: 0.1 10*3/uL (ref 0.0–0.5)
Eosinophils Relative: 1 %
HCT: 41.9 % (ref 39.0–52.0)
Hemoglobin: 14.3 g/dL (ref 13.0–17.0)
Immature Granulocytes: 0 %
Lymphocytes Relative: 34 %
Lymphs Abs: 2.8 10*3/uL (ref 0.7–4.0)
MCH: 27.8 pg (ref 26.0–34.0)
MCHC: 34.1 g/dL (ref 30.0–36.0)
MCV: 81.4 fL (ref 80.0–100.0)
Monocytes Absolute: 0.6 10*3/uL (ref 0.1–1.0)
Monocytes Relative: 7 %
Neutro Abs: 4.6 10*3/uL (ref 1.7–7.7)
Neutrophils Relative %: 57 %
Platelets: 260 10*3/uL (ref 150–400)
RBC: 5.15 MIL/uL (ref 4.22–5.81)
RDW: 13.4 % (ref 11.5–15.5)
WBC: 8.1 10*3/uL (ref 4.0–10.5)
nRBC: 0 % (ref 0.0–0.2)

## 2018-06-25 LAB — COMPREHENSIVE METABOLIC PANEL
ALT: 31 U/L (ref 0–44)
AST: 27 U/L (ref 15–41)
Albumin: 4.7 g/dL (ref 3.5–5.0)
Alkaline Phosphatase: 73 U/L (ref 38–126)
Anion gap: 11 (ref 5–15)
BUN: 23 mg/dL — ABNORMAL HIGH (ref 6–20)
CO2: 24 mmol/L (ref 22–32)
Calcium: 9.3 mg/dL (ref 8.9–10.3)
Chloride: 104 mmol/L (ref 98–111)
Creatinine, Ser: 1.45 mg/dL — ABNORMAL HIGH (ref 0.61–1.24)
GFR calc Af Amer: >60 mL/min (ref >60)
GFR calc non Af Amer: >60 mL/min (ref >60)
Glucose, Bld: 131 mg/dL — ABNORMAL HIGH (ref 70–99)
Potassium: 3.3 mmol/L — ABNORMAL LOW (ref 3.5–5.1)
Sodium: 139 mmol/L (ref 135–145)
Total Bilirubin: 0.6 mg/dL (ref 0.3–1.2)
Total Protein: 7.6 g/dL (ref 6.5–8.1)

## 2018-06-25 MED ORDER — SODIUM CHLORIDE 0.9 % IV BOLUS
2000.0000 mL | Freq: Once | INTRAVENOUS | Status: AC
  Administered 2018-06-25: 2000 mL via INTRAVENOUS

## 2018-06-25 MED ORDER — OXYCODONE-ACETAMINOPHEN 5-325 MG PO TABS
1.0000 | ORAL_TABLET | Freq: Once | ORAL | Status: AC
  Administered 2018-06-25: 1 via ORAL
  Filled 2018-06-25: qty 1

## 2018-06-25 NOTE — ED Triage Notes (Signed)
Pt to triage via w/c with no distress noted; pt reports left lower flank pain radiating down into buttocks and into left lower abd accomp by urinary frequency since last Wed with some hematuria noted; st hx of kidney stones

## 2018-06-26 ENCOUNTER — Encounter: Payer: Self-pay | Admitting: Emergency Medicine

## 2018-06-26 ENCOUNTER — Telehealth: Payer: Self-pay | Admitting: Urology

## 2018-06-26 MED ORDER — TAMSULOSIN HCL 0.4 MG PO CAPS: ORAL_CAPSULE | ORAL | 0 refills | Status: DC

## 2018-06-26 MED ORDER — OXYCODONE-ACETAMINOPHEN 5-325 MG PO TABS: 2.0000 | ORAL_TABLET | Freq: Four times a day (QID) | ORAL | 0 refills | Status: DC | PRN

## 2018-06-26 MED ORDER — ONDANSETRON 4 MG PO TBDP: ORAL_TABLET | ORAL | 0 refills | Status: DC

## 2018-06-26 MED ORDER — OXYCODONE-ACETAMINOPHEN 5-325 MG PO TABS
2.0000 | ORAL_TABLET | Freq: Once | ORAL | Status: AC
  Administered 2018-06-26: 2 via ORAL
  Filled 2018-06-26: qty 2

## 2018-06-26 MED ORDER — DOCUSATE SODIUM 100 MG PO CAPS: ORAL_CAPSULE | ORAL | 0 refills | Status: DC

## 2018-06-26 NOTE — Discharge Instructions (Addendum)
You have been seen in the Emergency Department (ED) today for pain that we believe based on your workup, is caused by kidney stones.  As we have discussed, please drink plenty of fluids.  Please make a follow up appointment with the physician(s) listed elsewhere in this documentation.  You may take pain medication as needed but ONLY as prescribed.  Please also take your prescribed Flomax daily.  Hold off on taking any ibuprofen, aspirin, naproxen, or other NSAIDs until you speak with Dr. Apolinar Junes about the possibility of lithotripsy on Thursday.  Please see your doctor as soon as possible as stones may take 1-3 weeks to pass and you may require additional care or medications.  Do not drink alcohol, drive or participate in any other potentially dangerous activities while taking opiate pain medication as it may make you sleepy. Do not take this medication with any other sedating medications, either prescription or over-the-counter. If you were prescribed Percocet or Vicodin, do not take these with acetaminophen (Tylenol) as it is already contained within these medications.   Take Percocet as needed for severe pain.  This medication is an opiate (or narcotic) pain medication and can be habit forming.  Use it as little as possible to achieve adequate pain control.  Do not use or use it with extreme caution if you have a history of opiate abuse or dependence.  If you are on a pain contract with your primary care doctor or a pain specialist, be sure to let them know you were prescribed this medication today from the [facility] Emergency Department.  This medication is intended for your use only - do not give any to anyone else and keep it in a secure place where nobody else, especially children, have access to it.  It will also cause or worsen constipation, so you may want to consider taking an over-the-counter stool softener while you are taking this medication.  Return to the Emergency Department (ED) or  call your doctor if you have any worsening pain, fever, painful urination, are unable to urinate, or develop other symptoms that concern you.

## 2018-06-26 NOTE — Telephone Encounter (Signed)
Pt called to give you permission to view his records.

## 2018-06-26 NOTE — ED Provider Notes (Signed)
[facility] Emergency Department Provider Note  ____________________________________________   First MD Initiated Contact with Patient 06/25/18 2309     (approximate)  I have reviewed the triage vital signs and the nursing notes.   HISTORY  Chief Complaint Flank Pain    HPI George Richards is a 38 y.o. male with medical history as listed below who presents for evaluation of about a week of intermittent pain in his left flank that radiates down the left side of his abdomen and into his genitals.  It is accompanied by a sense of urinary urgency and at least once he has seen blood in his urine.  He had his first acute episode about a week ago when he says he suddenly had a sharp pain and he urinated on himself and it was bloody.  He has had occasional more mild episodes since then but then tonight he had another episode of severe sharp stabbing pain with urinary urgency.  Sometimes he feels a pressure in his bowels to have a bowel movement and he is not able to do so although today he had a normal bowel movement without any difficulty.  He has some chronic lumbar spine issues with sciatica and some tingling that goes from his back down his left thigh but says that this feels different.  He does have a history of prior kidney stones.  He denies fever/chills, nasal congestion, sore throat, chest pain, shortness of breath, vomiting, and any other abdominal pain.  He had a little bit of nausea associated with the pain.  He has been trying to drink extra fluid because his wife is a Engineer, civil (consulting) and said that he may have a kidney stone and that he needs to try to stay hydrated.  He denies drug and alcohol use.         Past Medical History:  Diagnosis Date  . Anxiety   . History of kidney stones   . Hypertension   . 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 Myer Haff  . Lumbar radiculopathy, chronic    Followed by Dr.  Myer Haff, has asymmetric disc herniation at L5-S1    There are no active problems to display for this patient.   History reviewed. No pertinent surgical history.  Prior to Admission medications   Medication Sig Start Date End Date Taking? Authorizing Provider  amLODipine (NORVASC) 10 MG tablet Take 10 mg by mouth daily.    [provider]  clonazePAM (KLONOPIN) 1 MG tablet Take 1 mg by mouth 2 (two) times daily.    [provider]  cyclobenzaprine (FLEXERIL) 5 MG tablet Take 1-2 tablets 3 times daily as needed 04/02/18   Enid Derry, PA-C  docusate sodium (COLACE) 100 MG capsule Take 1 tablet once or twice daily as needed for constipation while taking narcotic pain medicine 06/26/18   Loleta Rose, MD  ibuprofen (ADVIL,MOTRIN) 600 MG tablet Take 1 tablet (600 mg total) by mouth every 6 (six) hours as needed. 04/02/18   Enid Derry, PA-C  ketorolac (ACULAR) 0.5 % ophthalmic solution Place 1 drop into the right eye 4 (four) times daily. 12/14/16   Cuthriell, Delorise Royals, PA-C  lidocaine (LIDODERM) 5 % Place 1 patch onto the skin daily. Remove & Discard patch within 12 hours or as directed by MD 04/02/18   Enid Derry, PA-C  ondansetron (ZOFRAN ODT) 4 MG disintegrating tablet Allow 1-2 tablets to dissolve in your mouth every 8  hours as needed for nausea/vomiting 06/26/18   Loleta Rose, MD  oxyCODONE-acetaminophen (PERCOCET) 5-325 MG tablet Take 2 tablets by mouth every 6 (six) hours as needed for severe pain. 06/26/18   Loleta Rose, MD  tamsulosin (FLOMAX) 0.4 MG CAPS capsule Take 1 tablet by mouth daily until you pass the kidney stone or no longer have symptoms 06/26/18   Loleta Rose, MD  trimethoprim-polymyxin b (POLYTRIM) ophthalmic solution Place 2 drops into the right eye every 6 (six) hours. 12/14/16   Cuthriell, Delorise Royals, PA-C    Allergies Patient has no known allergies.  History reviewed. No pertinent family history.  Social History Social History    Tobacco Use  . Smoking status: Never Smoker  . Smokeless tobacco: Never Used  Substance Use Topics  . Alcohol use: No  . Drug use: No    Review of Systems Constitutional: No fever/chills Eyes: No visual changes. ENT: No sore throat. Cardiovascular: Denies chest pain. Respiratory: Denies shortness of breath. Gastrointestinal: No abdominal pain.  No nausea, no vomiting.  No diarrhea.  No constipation. Genitourinary: Hematuria intermittently with some urinary urgency and one episode of incontinence. Musculoskeletal: Left-sided flank pain intermittently for about a week radiating into the left lower abdomen and down into his genitals.  Chronic low back pain with sciatica. Integumentary: Negative for rash. Neurological: Negative for headaches, focal weakness or numbness.   ____________________________________________   PHYSICAL EXAM:  VITAL SIGNS: ED Triage Vitals  Enc Vitals Group     BP 06/25/18 2345 (!) 146/98     Pulse Rate 06/25/18 2345 (!) 57     Resp 06/25/18 2345 19     Temp --      Temp src --      SpO2 06/25/18 2345 93 %     Weight 06/25/18 2147 117.9 kg (260 lb)     Height 06/25/18 2147 1.778 m (5\' 10" )     Head Circumference --      Peak Flow --      Pain Score 06/25/18 2147 8     Pain Loc --      Pain Edu? --      Excl. in GC? --     Constitutional: Alert and oriented. Well appearing and in no acute distress. Eyes: Conjunctivae are normal.  Head: Atraumatic. Nose: No congestion/rhinnorhea. Mouth/Throat: Mucous membranes are moist. Neck: No stridor.  No meningeal signs.   Cardiovascular: Normal rate, regular rhythm. Good peripheral circulation. Grossly normal heart sounds. Respiratory: Normal respiratory effort.  No retractions. No audible wheezing. Gastrointestinal: Soft and nondistended.  Diffuse tenderness to palpation throughout the abdomen without any focal tenderness. Musculoskeletal: Left CVA tenderness to percussion.  No lower extremity tenderness  nor edema. No gross deformities of extremities. Neurologic:  Normal speech and language. No gross focal neurologic deficits are appreciated.  He is able to ambulate without any difficulty and has no weakness in his extremities. Skin:  Skin is warm, dry and intact. No rash noted. Psychiatric: Mood and affect are normal. Speech and behavior are normal.  ____________________________________________   LABS (all labs ordered are listed, but only abnormal results are displayed)  Labs Reviewed  URINALYSIS, COMPLETE (UACMP) WITH MICROSCOPIC - Abnormal; Notable for the following components:      Result Value   Color, Urine YELLOW (*)    APPearance HAZY (*)    Hgb urine dipstick MODERATE (*)    All other components within normal limits  COMPREHENSIVE METABOLIC PANEL - Abnormal; Notable for the following components:  Potassium 3.3 (*)    Glucose, Bld 131 (*)    BUN 23 (*)    Creatinine, Ser 1.45 (*)    All other components within normal limits  CBC WITH DIFFERENTIAL/PLATELET   ____________________________________________  EKG  None - EKG not ordered by ED physician ____________________________________________  RADIOLOGY   ED MD interpretation: Patient has some mild left hydronephrosis with a 2 mm proximal left ureteral calculus.  Official radiology report(s): Ct Renal Stone Study  Result Date: 06/25/2018 CLINICAL DATA:  Left flank pain and hematuria for several days. Nephrolithiasis. EXAM: CT ABDOMEN AND PELVIS WITHOUT CONTRAST TECHNIQUE: Multidetector CT imaging of the abdomen and pelvis was performed following the standard protocol without IV contrast. COMPARISON:  09/05/2010 FINDINGS: Lower chest: No acute findings. Hepatobiliary: No mass visualized on this unenhanced exam. Decreased hepatic steatosis noted compared to previous exam. Gallbladder is unremarkable. Pancreas: No mass or inflammatory process visualized on this unenhanced exam. Spleen:  Within normal limits in size.  Adrenals/Urinary tract: Mild left hydronephrosis is seen due to 2 mm calculus in the proximal left ureter. Stomach/Bowel: No evidence of obstruction, inflammatory process, or abnormal fluid collections. Vascular/Lymphatic: No pathologically enlarged lymph nodes identified. No evidence of abdominal aortic aneurysm. Reproductive:  No mass or other significant abnormality. Other:  None. Musculoskeletal:  No suspicious bone lesions identified. IMPRESSION: Mild left hydronephrosis due to 2 mm proximal left ureteral calculus. Electronically Signed   By: Myles Rosenthal M.D.   On: 06/25/2018 23:57    ____________________________________________   PROCEDURES   Procedure(s) performed (including Critical Care):  Procedures   ____________________________________________   INITIAL IMPRESSION / MDM / ASSESSMENT AND PLAN / ED COURSE  As part of my medical decision making, I reviewed the following data within the electronic MEDICAL RECORD NUMBER Nursing notes reviewed and incorporated, Labs reviewed , Old chart reviewed, Notes from prior ED visits and [place] Controlled Substance Database      JENS SIEMS was evaluated in Emergency Department on 06/26/2018 for the symptoms described in the history of present illness. He was evaluated in the context of the global COVID-19 pandemic, which necessitated consideration that the patient might be at risk for infection with the SARS-CoV-2 virus that causes COVID-19. Institutional protocols and algorithms that pertain to the evaluation of patients at risk for COVID-19 are in a state of rapid change based on information released by regulatory bodies including the CDC and federal and state organizations. These policies and algorithms were followed during the patient's care in the ED.  Differential diagnosis includes, but is not limited to, renal colic/ureteral stone, UTI/pyelonephritis, less likely cauda equina syndrome, epidural abscess, or other acute spinal issue.  The patient  has no acute neurological deficits that I can appreciate in the setting of chronic lumbar radiculopathy with known mild disc herniation at L5/S1.  I reviewed his notes from Dr. Myer Haff as well as Dr. Yves Dill and it sounds like he is being managed well in terms of the mild disc herniation.  Particularly given the report of gross hematuria and his prior history of kidney stones, I think this is by far the most likely.  I will obtain a CT renal stone protocol.  If this is completely normal I will plan for a lumbar spine MRI to rule out cauda equina syndrome, but I think this is much less likely.  Urinalysis is pending.  Comprehensive metabolic panel is notable for some acute kidney injury or acute renal dysfunction with a creatinine of 1.45 which is elevated above  his baseline.  I am giving him 2 L of IV fluids and encouraged him to continue his fluid hydration but this alone is not reason for admission.  His CBC is normal with no leukocytosis.  He has only minimal discomfort at this time and is comfortable with the current plan.  Clinical Course as of Jun 26 155  Tue Jun 25, 2018  2355 Positive for hematuria, no indication of infection  Urinalysis, Complete w Microscopic(!) [CF]  Wed Jun 26, 2018  [id number] The patient has a 2 mm proximal left ureteral stone with some mild hydronephrosis.  I believe this is consistent with his symptoms and much more likely than having a concurrent cauda equina syndrome or other acute/emergent abnormality of his lumbar spine.  The patient is in no distress.  I will give him 2 Percocet to help throughout the night and I strongly encouraged him to follow-up with Dr. Apolinar JunesBrandon in the morning to discuss the possibility of lithotripsy on Thursday.  I told him to avoid NSAIDs and gave my usual customary return precautions.  He understands and agrees with the plan.   [CF]    Clinical Course User Index [CF] Loleta RoseForbach, Obinna Ehresman, MD     ____________________________________________   FINAL CLINICAL IMPRESSION(S) / ED DIAGNOSES  Final diagnoses:  Ureteral stone with hydronephrosis  Left flank pain     MEDICATIONS GIVEN DURING THIS VISIT:  Medications  oxyCODONE-acetaminophen (PERCOCET/ROXICET) 5-325 MG per tablet 1 tablet (1 tablet Oral Given 06/25/18 2154)  sodium chloride 0.9 % bolus 2,000 mL (0 mLs Intravenous Stopped 06/26/18 0115)  oxyCODONE-acetaminophen (PERCOCET/ROXICET) 5-325 MG per tablet 2 tablet (2 tablets Oral Given 06/26/18 0054)     ED Discharge Orders         Ordered    oxyCODONE-acetaminophen (PERCOCET) 5-325 MG tablet  Every 6 hours PRN     06/26/18 0103    tamsulosin (FLOMAX) 0.4 MG CAPS capsule     06/26/18 0103    ondansetron (ZOFRAN ODT) 4 MG disintegrating tablet     06/26/18 0103    docusate sodium (COLACE) 100 MG capsule     06/26/18 0103           Note:  This document was prepared using Dragon voice recognition software and may include unintentional dictation errors.   Loleta RoseForbach, Ulani Degrasse, MD 06/26/18 0157

## 2018-08-01 ENCOUNTER — Emergency Department: Payer: No Typology Code available for payment source

## 2018-08-01 ENCOUNTER — Other Ambulatory Visit: Payer: Self-pay

## 2018-08-01 ENCOUNTER — Emergency Department
Admission: EM | Admit: 2018-08-01 | Discharge: 2018-08-01 | Disposition: A | Discharge status: Home / Self Care | Payer: No Typology Code available for payment source | Attending: Emergency Medicine | Admitting: Emergency Medicine

## 2018-08-01 ENCOUNTER — Encounter: Payer: Self-pay | Admitting: Emergency Medicine

## 2018-08-01 DIAGNOSIS — N201 Calculus of ureter: Secondary | ICD-10-CM | POA: Insufficient documentation

## 2018-08-01 DIAGNOSIS — Z87442 Personal history of urinary calculi: Secondary | ICD-10-CM | POA: Insufficient documentation

## 2018-08-01 DIAGNOSIS — I10 Essential (primary) hypertension: Secondary | ICD-10-CM | POA: Insufficient documentation

## 2018-08-01 DIAGNOSIS — Z79899 Other long term (current) drug therapy: Secondary | ICD-10-CM | POA: Insufficient documentation

## 2018-08-01 DIAGNOSIS — N200 Calculus of kidney: Secondary | ICD-10-CM

## 2018-08-01 LAB — COMPREHENSIVE METABOLIC PANEL
ALT: 31 U/L (ref 0–44)
AST: 28 U/L (ref 15–41)
Albumin: 4.8 g/dL (ref 3.5–5.0)
Alkaline Phosphatase: 73 U/L (ref 38–126)
Anion gap: 12 (ref 5–15)
BUN: 26 mg/dL — ABNORMAL HIGH (ref 6–20)
CO2: 23 mmol/L (ref 22–32)
Calcium: 9.2 mg/dL (ref 8.9–10.3)
Chloride: 105 mmol/L (ref 98–111)
Creatinine, Ser: 1.65 mg/dL — ABNORMAL HIGH (ref 0.61–1.24)
GFR calc Af Amer: 60 mL/min — ABNORMAL LOW (ref >60)
GFR calc non Af Amer: 52 mL/min — ABNORMAL LOW (ref >60)
Glucose, Bld: 148 mg/dL — ABNORMAL HIGH (ref 70–99)
Potassium: 3.6 mmol/L (ref 3.5–5.1)
Sodium: 140 mmol/L (ref 135–145)
Total Bilirubin: 1 mg/dL (ref 0.3–1.2)
Total Protein: 8.1 g/dL (ref 6.5–8.1)

## 2018-08-01 LAB — URINALYSIS, COMPLETE (UACMP) WITH MICROSCOPIC
Bacteria, UA: NONE SEEN
Bilirubin Urine: NEGATIVE
Glucose, UA: 50 mg/dL — AB
Ketones, ur: 20 mg/dL — AB
Leukocytes,Ua: NEGATIVE
Nitrite: NEGATIVE
Protein, ur: 30 mg/dL — AB
Specific Gravity, Urine: 1.018 (ref 1.005–1.030)
Squamous Epithelial / HPF: NONE SEEN (ref 0–5)
pH: 7 (ref 5.0–8.0)

## 2018-08-01 LAB — CBC WITH DIFFERENTIAL/PLATELET
Abs Immature Granulocytes: 0.06 10*3/uL (ref 0.00–0.07)
Basophils Absolute: 0.1 10*3/uL (ref 0.0–0.1)
Basophils Relative: 0 %
Eosinophils Absolute: 0 10*3/uL (ref 0.0–0.5)
Eosinophils Relative: 0 %
HCT: 41.9 % (ref 39.0–52.0)
Hemoglobin: 14.4 g/dL (ref 13.0–17.0)
Immature Granulocytes: 0 %
Lymphocytes Relative: 14 %
Lymphs Abs: 2.1 10*3/uL (ref 0.7–4.0)
MCH: 28 pg (ref 26.0–34.0)
MCHC: 34.4 g/dL (ref 30.0–36.0)
MCV: 81.4 fL (ref 80.0–100.0)
Monocytes Absolute: 0.9 10*3/uL (ref 0.1–1.0)
Monocytes Relative: 6 %
Neutro Abs: 11.5 10*3/uL — ABNORMAL HIGH (ref 1.7–7.7)
Neutrophils Relative %: 80 %
Platelets: 266 10*3/uL (ref 150–400)
RBC: 5.15 MIL/uL (ref 4.22–5.81)
RDW: 13.3 % (ref 11.5–15.5)
WBC: 14.6 10*3/uL — ABNORMAL HIGH (ref 4.0–10.5)
nRBC: 0 % (ref 0.0–0.2)

## 2018-08-01 LAB — LIPASE, BLOOD: Lipase: 26 U/L (ref 11–51)

## 2018-08-01 MED ORDER — OXYCODONE-ACETAMINOPHEN 5-325 MG PO TABS: 1.0000 | ORAL_TABLET | ORAL | 0 refills | Status: DC | PRN

## 2018-08-01 MED ORDER — ONDANSETRON HCL 4 MG/2ML IJ SOLN
4.0000 mg | Freq: Once | INTRAMUSCULAR | Status: AC
  Administered 2018-08-01: 4 mg via INTRAVENOUS
  Filled 2018-08-01: qty 2

## 2018-08-01 MED ORDER — TAMSULOSIN HCL 0.4 MG PO CAPS: 0.4000 mg | ORAL_CAPSULE | Freq: Every day | ORAL | 0 refills | Status: AC

## 2018-08-01 MED ORDER — KETOROLAC TROMETHAMINE 30 MG/ML IJ SOLN
30.0000 mg | Freq: Once | INTRAMUSCULAR | Status: AC
  Administered 2018-08-01: 30 mg via INTRAVENOUS
  Filled 2018-08-01: qty 1

## 2018-08-01 MED ORDER — ONDANSETRON 4 MG PO TBDP: 4.0000 mg | ORAL_TABLET | Freq: Three times a day (TID) | ORAL | 0 refills | Status: DC | PRN

## 2018-08-01 MED ORDER — SODIUM CHLORIDE 0.9 % IV BOLUS
1000.0000 mL | Freq: Once | INTRAVENOUS | Status: AC
  Administered 2018-08-01: 1000 mL via INTRAVENOUS

## 2018-08-01 MED ORDER — MORPHINE SULFATE (PF) 4 MG/ML IV SOLN
4.0000 mg | Freq: Once | INTRAVENOUS | Status: AC
  Administered 2018-08-01: 4 mg via INTRAVENOUS
  Filled 2018-08-01: qty 1

## 2018-08-01 NOTE — ED Triage Notes (Signed)
Patient ambulatory to triage with steady gait, without difficulty or distress noted; pt reports lower abd/bladder pain since this morning accomp by N/V/D; st hx kidney stone

## 2018-08-01 NOTE — ED Provider Notes (Signed)
[facility] Emergency Department Provider Note ___________   First MD Initiated Contact with Patient 08/01/18 [phone]     (approximate)  I have reviewed the triage vital signs and the nursing notes.   HISTORY  Chief Complaint Abdominal Pain   HPI George Richards is a 38 y.o. male with below list of previous medical conditions including kidney stones presents to the emergency department with acute onset of left flank/left lower quadrant abdominal pain with current pain score of 10 out of 10 which began tonight.  Patient also admits to nausea and vomiting.  Patient denies any fever afebrile on presentation.        Past Medical History:  Diagnosis Date  . Anxiety   . History of kidney stones   . Hypertension   . 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 Myer Haff  . Lumbar radiculopathy, chronic    Followed by Dr. Myer Haff, has asymmetric disc herniation at L5-S1    There are no active problems to display for this patient.   History reviewed. No pertinent surgical history.  Prior to Admission medications   Medication Sig Start Date End Date Taking? Authorizing Provider  clonazePAM (KLONOPIN) 1 MG tablet Take 1 mg by mouth 2 (two) times daily.   Yes [provider]  oxyCODONE-acetaminophen (PERCOCET) 5-325 MG tablet Take 2 tablets by mouth every 6 (six) hours as needed for severe pain. 06/26/18  Yes Loleta Rose, MD  amLODipine (NORVASC) 10 MG tablet Take 10 mg by mouth daily.    [provider]  cyclobenzaprine (FLEXERIL) 5 MG tablet Take 1-2 tablets 3 times daily as needed 04/02/18   Enid Derry, PA-C  metoprolol succinate (TOPROL-XL) 50 MG 24 hr tablet Take 50 mg by mouth daily. 12/15/16   [provider]    Allergies Patient has no known allergies.  No family history on file.  Social History Social History   Tobacco Use  . Smoking status: Never Smoker  .  Smokeless tobacco: Never Used  Substance Use Topics  . Alcohol use: No  . Drug use: No    Review of Systems Constitutional: No fever/chills Eyes: No visual changes. ENT: No sore throat. Cardiovascular: Denies chest pain. Respiratory: Denies shortness of breath. Gastrointestinal: Positive for left flank/left lower quadrant abdominal pain.  No nausea, no vomiting.  No diarrhea.  No constipation. Genitourinary: Negative for dysuria. Musculoskeletal: Negative for neck pain.  Negative for back pain. Integumentary: Negative for rash. Neurological: Negative for headaches, focal weakness or numbness.   ____________________________________________   PHYSICAL EXAM:  VITAL SIGNS: ED Triage Vitals  Enc Vitals Group     BP 08/01/18 0233 (!) 113/91     Pulse Rate 08/01/18 0233 86     Resp 08/01/18 0233 16     Temp 08/01/18 0233 97.8 F (36.6 C)     Temp Source 08/01/18 0233 Oral     SpO2 08/01/18 0233 99 %     Weight 08/01/18 0222 117.9 kg (260 lb)     Height 08/01/18 0222 1.778 m (5\' 10" )     Head Circumference --      Peak Flow --      Pain Score 08/01/18 0222 8     Pain Loc --      Pain Edu? --      Excl. in GC? --     Constitutional: Alert and oriented.  Apparent discomfort Eyes: Conjunctivae are normal.  Mouth/Throat:  Mucous membranes are moist.  Oropharynx non-erythematous. Neck: No stridor.   Cardiovascular: Normal rate, regular rhythm. Good peripheral circulation. Grossly normal heart sounds. Respiratory: Normal respiratory effort.  No retractions. No audible wheezing. Gastrointestinal: Soft and nontender. No distention.  Musculoskeletal: No lower extremity tenderness nor edema. No gross deformities of extremities. Neurologic:  Normal speech and language. No gross focal neurologic deficits are appreciated.  Skin:  Skin is warm, dry and intact. No rash noted. Psychiatric: Mood and affect are normal. Speech and behavior are normal.   ____________________________________________   LABS (all labs ordered are listed, but only abnormal results are displayed)  Labs Reviewed  CBC WITH DIFFERENTIAL/PLATELET - Abnormal; Notable for the following components:      Result Value   WBC 14.6 (*)    Neutro Abs 11.5 (*)    All other components within normal limits  COMPREHENSIVE METABOLIC PANEL - Abnormal; Notable for the following components:   Glucose, Bld 148 (*)    BUN 26 (*)    Creatinine, Ser 1.65 (*)    GFR calc non Af Amer 52 (*)    GFR calc Af Amer 60 (*)    All other components within normal limits  URINALYSIS, COMPLETE (UACMP) WITH MICROSCOPIC - Abnormal; Notable for the following components:   Color, Urine AMBER (*)    APPearance CLEAR (*)    Glucose, UA 50 (*)    Hgb urine dipstick SMALL (*)    Ketones, ur 20 (*)    Protein, ur 30 (*)    All other components within normal limits  LIPASE, BLOOD   ________________  RADIOLOGY I, Arley N Dakoda Bassette, personally viewed and evaluated these images (plain radiographs) as part of my medical decision making, as well as reviewing the written report by the radiologist.  ED MD interpretation: Obstructing 3 mm left UVJ calculus.  Official radiology report(s): Ct Renal Stone Study  Result Date: 08/01/2018 CLINICAL DATA:  Left flank and lower quadrant pain EXAM: CT ABDOMEN AND PELVIS WITHOUT CONTRAST TECHNIQUE: Multidetector CT imaging of the abdomen and pelvis was performed following the standard protocol without IV contrast. COMPARISON:  06/25/2018 FINDINGS: Lower chest:  Generous heart size.  No acute finding Hepatobiliary: Hepatic steatosis with mild sparing at the gallbladder fossa.No evidence of biliary obstruction or stone. Pancreas: Unremarkable. Spleen: Unremarkable. Adrenals/Urinary Tract: Negative adrenals. Left hydroureteronephrosis, renal expansion, and cortical low-density from a 3 mm stone at the UVJ. No additional urolithiasis. Unremarkable bladder. Stomach/Bowel:   No obstruction. No appendicitis. Vascular/Lymphatic: No acute vascular abnormality. No mass or adenopathy. Reproductive:No pathologic findings. Other: No ascites or pneumoperitoneum.  Tiny fatty umbilical hernia. Musculoskeletal: No acute abnormalities. IMPRESSION: 1. Obstructing 3 mm left UVJ calculus. 2. Hepatic steatosis. Electronically Signed   By: Marnee Spring M.D.   On: 08/01/2018 05:51      Procedures   ____________________________________________   INITIAL IMPRESSION / MDM / ASSESSMENT AND PLAN / ED COURSE  As part of my medical decision making, I reviewed the following data within the electronic MEDICAL RECORD NUMBER   38 year old male presenting with above-stated history and physical exam concerning for possible left kidney stone.  This was confirmed with CT which revealed a 3 mm left UVJ stone.  Patient given IV morphine 4 mg Zofran 4 mg and subsequently Toradol 30 mg with improvement of pain.  *LAROY MUSTARD was evaluated in Emergency Department on 08/01/2018 for the symptoms described in the history of present illness. He was evaluated in the context of the global COVID-19  pandemic, which necessitated consideration that the patient might be at risk for infection with the SARS-CoV-2 virus that causes COVID-19. Institutional protocols and algorithms that pertain to the evaluation of patients at risk for COVID-19 are in a state of rapid change based on information released by regulatory bodies including the CDC and federal and state organizations. These policies and algorithms were followed during the patient's care in the ED.  Some ED evaluations and interventions may be delayed as a result of limited staffing during the pandemic.*   ____________________________________________  FINAL CLINICAL IMPRESSION(S) / ED DIAGNOSES  Final diagnoses:  Kidney stone on left side     MEDICATIONS GIVEN DURING THIS VISIT:  Medications  ketorolac (TORADOL) 30 MG/ML injection 30 mg (has no  administration in time range)  morphine 4 MG/ML injection 4 mg (4 mg Intravenous Given 08/01/18 0513)  ondansetron (ZOFRAN) injection 4 mg (4 mg Intravenous Given 08/01/18 0513)  sodium chloride 0.9 % bolus 1,000 mL (1,000 mLs Intravenous New Bag/Given 08/01/18 0532)     ED Discharge Orders    None       Note:  This document was prepared using Dragon voice recognition software and may include unintentional dictation errors.   Darci CurrentBrown, Tukwila N, MD 08/01/18 [phone]

## 2019-05-10 ENCOUNTER — Inpatient Hospital Stay
Admission: EM | Admit: 2019-05-10 | Discharge: 2019-05-11 | DRG: 177 | Disposition: A | Discharge status: Home / Self Care | Payer: 59 | Attending: Internal Medicine | Admitting: Internal Medicine

## 2019-05-10 ENCOUNTER — Encounter: Payer: Self-pay | Admitting: Internal Medicine

## 2019-05-10 ENCOUNTER — Other Ambulatory Visit: Payer: Self-pay

## 2019-05-10 ENCOUNTER — Emergency Department: Payer: 59

## 2019-05-10 DIAGNOSIS — M5116 Intervertebral disc disorders with radiculopathy, lumbar region: Secondary | ICD-10-CM | POA: Diagnosis present

## 2019-05-10 DIAGNOSIS — U071 COVID-19: Principal | ICD-10-CM | POA: Diagnosis present

## 2019-05-10 DIAGNOSIS — Z03818 Encounter for observation for suspected exposure to other biological agents ruled out: Secondary | ICD-10-CM | POA: Diagnosis not present

## 2019-05-10 DIAGNOSIS — R778 Other specified abnormalities of plasma proteins: Secondary | ICD-10-CM | POA: Diagnosis not present

## 2019-05-10 DIAGNOSIS — E876 Hypokalemia: Secondary | ICD-10-CM | POA: Diagnosis present

## 2019-05-10 DIAGNOSIS — N200 Calculus of kidney: Secondary | ICD-10-CM | POA: Diagnosis present

## 2019-05-10 DIAGNOSIS — M5416 Radiculopathy, lumbar region: Secondary | ICD-10-CM | POA: Diagnosis not present

## 2019-05-10 DIAGNOSIS — Z79891 Long term (current) use of opiate analgesic: Secondary | ICD-10-CM

## 2019-05-10 DIAGNOSIS — G8929 Other chronic pain: Secondary | ICD-10-CM | POA: Diagnosis present

## 2019-05-10 DIAGNOSIS — J189 Pneumonia, unspecified organism: Secondary | ICD-10-CM

## 2019-05-10 DIAGNOSIS — I10 Essential (primary) hypertension: Secondary | ICD-10-CM | POA: Diagnosis not present

## 2019-05-10 DIAGNOSIS — R112 Nausea with vomiting, unspecified: Secondary | ICD-10-CM | POA: Diagnosis present

## 2019-05-10 DIAGNOSIS — J1282 Pneumonia due to coronavirus disease 2019: Secondary | ICD-10-CM | POA: Diagnosis not present

## 2019-05-10 DIAGNOSIS — R197 Diarrhea, unspecified: Secondary | ICD-10-CM | POA: Diagnosis present

## 2019-05-10 DIAGNOSIS — Z79899 Other long term (current) drug therapy: Secondary | ICD-10-CM

## 2019-05-10 DIAGNOSIS — Z87442 Personal history of urinary calculi: Secondary | ICD-10-CM | POA: Diagnosis not present

## 2019-05-10 DIAGNOSIS — F419 Anxiety disorder, unspecified: Secondary | ICD-10-CM | POA: Diagnosis not present

## 2019-05-10 DIAGNOSIS — J9601 Acute respiratory failure with hypoxia: Secondary | ICD-10-CM | POA: Diagnosis present

## 2019-05-10 DIAGNOSIS — R45851 Suicidal ideations: Secondary | ICD-10-CM | POA: Diagnosis not present

## 2019-05-10 DIAGNOSIS — R0602 Shortness of breath: Secondary | ICD-10-CM | POA: Diagnosis not present

## 2019-05-10 LAB — TROPONIN I (HIGH SENSITIVITY)
Troponin I (High Sensitivity): 45 ng/L — ABNORMAL HIGH (ref <18)
Troponin I (High Sensitivity): 44 ng/L — ABNORMAL HIGH (ref <18)
Troponin I (High Sensitivity): 43 ng/L — ABNORMAL HIGH (ref <18)
Troponin I (High Sensitivity): 33 ng/L — ABNORMAL HIGH (ref <18)
Troponin I (High Sensitivity): 29 ng/L — ABNORMAL HIGH (ref <18)

## 2019-05-10 LAB — RESPIRATORY PANEL BY RT PCR (FLU A&B, COVID)
Influenza A by PCR: NEGATIVE
Influenza B by PCR: NEGATIVE
SARS Coronavirus 2 by RT PCR: POSITIVE — AB

## 2019-05-10 LAB — HIV ANTIBODY (ROUTINE TESTING W REFLEX): HIV Screen 4th Generation wRfx: NONREACTIVE

## 2019-05-10 LAB — CBC WITH DIFFERENTIAL/PLATELET
Abs Immature Granulocytes: 0.02 10*3/uL (ref 0.00–0.07)
Basophils Absolute: 0 10*3/uL (ref 0.0–0.1)
Basophils Relative: 0 %
Eosinophils Absolute: 0 10*3/uL (ref 0.0–0.5)
Eosinophils Relative: 0 %
HCT: 40.8 % (ref 39.0–52.0)
Hemoglobin: 14 g/dL (ref 13.0–17.0)
Immature Granulocytes: 0 %
Lymphocytes Relative: 26 %
Lymphs Abs: 1.2 10*3/uL (ref 0.7–4.0)
MCH: 27.1 pg (ref 26.0–34.0)
MCHC: 34.3 g/dL (ref 30.0–36.0)
MCV: 78.9 fL — ABNORMAL LOW (ref 80.0–100.0)
Monocytes Absolute: 0.4 10*3/uL (ref 0.1–1.0)
Monocytes Relative: 9 %
Neutro Abs: 3 10*3/uL (ref 1.7–7.7)
Neutrophils Relative %: 65 %
Platelets: 164 10*3/uL (ref 150–400)
RBC: 5.17 MIL/uL (ref 4.22–5.81)
RDW: 13.7 % (ref 11.5–15.5)
WBC: 4.7 10*3/uL (ref 4.0–10.5)
nRBC: 0 % (ref 0.0–0.2)

## 2019-05-10 LAB — HEPATIC FUNCTION PANEL
ALT: 66 U/L — ABNORMAL HIGH (ref 0–44)
AST: 107 U/L — ABNORMAL HIGH (ref 15–41)
Albumin: 4.1 g/dL (ref 3.5–5.0)
Alkaline Phosphatase: 52 U/L (ref 38–126)
Bilirubin, Direct: 0.2 mg/dL (ref 0.0–0.2)
Indirect Bilirubin: 0.4 mg/dL (ref 0.3–0.9)
Total Bilirubin: 0.6 mg/dL (ref 0.3–1.2)
Total Protein: 7.5 g/dL (ref 6.5–8.1)

## 2019-05-10 LAB — TRIGLYCERIDES: Triglycerides: 67 mg/dL (ref <150)

## 2019-05-10 LAB — FERRITIN: Ferritin: 1184 ng/mL — ABNORMAL HIGH (ref 24–336)

## 2019-05-10 LAB — FIBRINOGEN: Fibrinogen: 489 mg/dL — ABNORMAL HIGH (ref 210–475)

## 2019-05-10 LAB — BASIC METABOLIC PANEL
Anion gap: 9 (ref 5–15)
BUN: 26 mg/dL — ABNORMAL HIGH (ref 6–20)
CO2: 26 mmol/L (ref 22–32)
Calcium: 8.3 mg/dL — ABNORMAL LOW (ref 8.9–10.3)
Chloride: 103 mmol/L (ref 98–111)
Creatinine, Ser: 1.35 mg/dL — ABNORMAL HIGH (ref 0.61–1.24)
GFR calc Af Amer: >60 mL/min (ref >60)
GFR calc non Af Amer: >60 mL/min (ref >60)
Glucose, Bld: 109 mg/dL — ABNORMAL HIGH (ref 70–99)
Potassium: 3.3 mmol/L — ABNORMAL LOW (ref 3.5–5.1)
Sodium: 138 mmol/L (ref 135–145)

## 2019-05-10 LAB — LACTIC ACID, PLASMA: Lactic Acid, Venous: 1 mmol/L (ref 0.5–1.9)

## 2019-05-10 LAB — BRAIN NATRIURETIC PEPTIDE: B Natriuretic Peptide: 24 pg/mL (ref 0.0–100.0)

## 2019-05-10 LAB — LACTATE DEHYDROGENASE: LDH: 365 U/L — ABNORMAL HIGH (ref 98–192)

## 2019-05-10 LAB — POC SARS CORONAVIRUS 2 AG: SARS Coronavirus 2 Ag: NEGATIVE

## 2019-05-10 LAB — HEPATITIS B SURFACE ANTIGEN: Hepatitis B Surface Ag: NONREACTIVE

## 2019-05-10 LAB — PROCALCITONIN: Procalcitonin: <0.1 ng/mL

## 2019-05-10 LAB — C-REACTIVE PROTEIN: CRP: 1.1 mg/dL — ABNORMAL HIGH (ref <1.0)

## 2019-05-10 LAB — FIBRIN DERIVATIVES D-DIMER (ARMC ONLY): Fibrin derivatives D-dimer (ARMC): 1034.19 ng/mL (FEU) — ABNORMAL HIGH (ref 0.00–499.00)

## 2019-05-10 MED ORDER — CYCLOBENZAPRINE HCL 5 MG PO TABS
7.5000 mg | ORAL_TABLET | Freq: Three times a day (TID) | ORAL | Status: DC | PRN
  Filled 2019-05-10: qty 1.5

## 2019-05-10 MED ORDER — ENOXAPARIN SODIUM 40 MG/0.4ML ~~LOC~~ SOLN
40.0000 mg | SUBCUTANEOUS | Status: DC
  Administered 2019-05-10: 40 mg via SUBCUTANEOUS
  Filled 2019-05-10: qty 0.4

## 2019-05-10 MED ORDER — ASCORBIC ACID 500 MG PO TABS
500.0000 mg | ORAL_TABLET | Freq: Every day | ORAL | Status: DC
  Administered 2019-05-10 – 2019-05-11 (×2): 500 mg via ORAL
  Filled 2019-05-10 (×2): qty 1

## 2019-05-10 MED ORDER — POTASSIUM CHLORIDE CRYS ER 20 MEQ PO TBCR
40.0000 meq | EXTENDED_RELEASE_TABLET | Freq: Once | ORAL | Status: AC
  Administered 2019-05-10: 40 meq via ORAL
  Filled 2019-05-10: qty 2

## 2019-05-10 MED ORDER — IPRATROPIUM BROMIDE HFA 17 MCG/ACT IN AERS
2.0000 | INHALATION_SPRAY | RESPIRATORY_TRACT | Status: DC
  Administered 2019-05-10 – 2019-05-11 (×6): 2 via RESPIRATORY_TRACT
  Filled 2019-05-10 (×2): qty 12.9

## 2019-05-10 MED ORDER — LACTATED RINGERS IV BOLUS
1000.0000 mL | Freq: Once | INTRAVENOUS | Status: AC
  Administered 2019-05-10: 1000 mL via INTRAVENOUS

## 2019-05-10 MED ORDER — SODIUM CHLORIDE 0.9 % IV SOLN
100.0000 mg | Freq: Every day | INTRAVENOUS | Status: DC
  Administered 2019-05-11: 100 mg via INTRAVENOUS
  Filled 2019-05-10 (×2): qty 20

## 2019-05-10 MED ORDER — IPRATROPIUM BROMIDE HFA 17 MCG/ACT IN AERS: 2.0000 | INHALATION_SPRAY | RESPIRATORY_TRACT | Status: DC

## 2019-05-10 MED ORDER — LOPERAMIDE HCL 2 MG PO CAPS: 2.0000 mg | ORAL_CAPSULE | Freq: Three times a day (TID) | ORAL | Status: DC | PRN

## 2019-05-10 MED ORDER — CLONAZEPAM 1 MG PO TABS
1.0000 mg | ORAL_TABLET | Freq: Two times a day (BID) | ORAL | Status: DC
  Administered 2019-05-10 – 2019-05-11 (×3): 1 mg via ORAL
  Filled 2019-05-10: qty 1
  Filled 2019-05-10: qty 2
  Filled 2019-05-10: qty 1

## 2019-05-10 MED ORDER — SODIUM CHLORIDE 0.9 % IV SOLN
200.0000 mg | Freq: Once | INTRAVENOUS | Status: AC
  Administered 2019-05-10: 11:00:00 200 mg via INTRAVENOUS
  Filled 2019-05-10: qty 40

## 2019-05-10 MED ORDER — METOPROLOL SUCCINATE ER 50 MG PO TB24
50.0000 mg | ORAL_TABLET | Freq: Every day | ORAL | Status: DC
  Administered 2019-05-10 – 2019-05-11 (×2): 50 mg via ORAL
  Filled 2019-05-10 (×2): qty 1

## 2019-05-10 MED ORDER — ALBUTEROL SULFATE (2.5 MG/3ML) 0.083% IN NEBU: 2.5000 mg | INHALATION_SOLUTION | Freq: Once | RESPIRATORY_TRACT | Status: DC

## 2019-05-10 MED ORDER — ONDANSETRON HCL 4 MG/2ML IJ SOLN
4.0000 mg | Freq: Once | INTRAMUSCULAR | Status: AC
  Administered 2019-05-10: 4 mg via INTRAVENOUS
  Filled 2019-05-10: qty 2

## 2019-05-10 MED ORDER — ONDANSETRON HCL 4 MG/2ML IJ SOLN: 4.0000 mg | Freq: Three times a day (TID) | INTRAMUSCULAR | Status: DC | PRN

## 2019-05-10 MED ORDER — ZINC SULFATE 220 (50 ZN) MG PO CAPS
220.0000 mg | ORAL_CAPSULE | Freq: Every day | ORAL | Status: DC
  Administered 2019-05-10 – 2019-05-11 (×2): 220 mg via ORAL
  Filled 2019-05-10 (×2): qty 1

## 2019-05-10 MED ORDER — HYDRALAZINE HCL 50 MG PO TABS: 25.0000 mg | ORAL_TABLET | Freq: Three times a day (TID) | ORAL | Status: DC | PRN

## 2019-05-10 MED ORDER — ALBUTEROL SULFATE HFA 108 (90 BASE) MCG/ACT IN AERS
2.0000 | INHALATION_SPRAY | RESPIRATORY_TRACT | Status: DC | PRN
  Filled 2019-05-10: qty 6.7

## 2019-05-10 MED ORDER — ACETAMINOPHEN 325 MG PO TABS
650.0000 mg | ORAL_TABLET | Freq: Four times a day (QID) | ORAL | Status: DC | PRN
  Administered 2019-05-10: 650 mg via ORAL
  Filled 2019-05-10: qty 2

## 2019-05-10 MED ORDER — DEXAMETHASONE SODIUM PHOSPHATE 10 MG/ML IJ SOLN
10.0000 mg | Freq: Once | INTRAMUSCULAR | Status: AC
  Administered 2019-05-10: 10 mg via INTRAVENOUS
  Filled 2019-05-10: qty 1

## 2019-05-10 MED ORDER — SODIUM CHLORIDE 0.9 % IV SOLN: INTRAVENOUS | Status: DC | PRN

## 2019-05-10 MED ORDER — OXYCODONE-ACETAMINOPHEN 5-325 MG PO TABS: 1.0000 | ORAL_TABLET | Freq: Four times a day (QID) | ORAL | Status: DC | PRN

## 2019-05-10 MED ORDER — DM-GUAIFENESIN ER 30-600 MG PO TB12
1.0000 | ORAL_TABLET | Freq: Two times a day (BID) | ORAL | Status: DC
  Filled 2019-05-10: qty 1

## 2019-05-10 MED ORDER — METHYLPREDNISOLONE SODIUM SUCC 125 MG IJ SOLR
60.0000 mg | Freq: Two times a day (BID) | INTRAMUSCULAR | Status: DC
  Administered 2019-05-10 – 2019-05-11 (×2): 60 mg via INTRAVENOUS
  Filled 2019-05-10 (×2): qty 2

## 2019-05-10 MED ORDER — ALBUTEROL SULFATE HFA 108 (90 BASE) MCG/ACT IN AERS: 2.0000 | INHALATION_SPRAY | RESPIRATORY_TRACT | Status: DC | PRN

## 2019-05-10 MED ORDER — ACETAMINOPHEN 500 MG PO TABS
1000.0000 mg | ORAL_TABLET | Freq: Once | ORAL | Status: AC
  Administered 2019-05-10: 1000 mg via ORAL
  Filled 2019-05-10: qty 2

## 2019-05-10 MED ORDER — IPRATROPIUM-ALBUTEROL 20-100 MCG/ACT IN AERS
1.0000 | INHALATION_SPRAY | Freq: Four times a day (QID) | RESPIRATORY_TRACT | Status: DC
  Filled 2019-05-10: qty 4

## 2019-05-10 NOTE — ED Triage Notes (Signed)
Patient reports feeling "sick" since last Saturday, short of breath since Wednesday worsening shortness of breath during the night.

## 2019-05-10 NOTE — Progress Notes (Signed)
Remdesivir - Pharmacy Brief Note   O:  CXR: IMPRESSION: Multifocal pneumonia.  SpO2: 96% on 2L [place]   A/P:  Remdesivir 200 mg IVPB once followed by 100 mg IVPB daily x 4 days.   Thomasene Ripple, PharmD, BCPS Clinical Pharmacist 05/10/2019 6:49 AM

## 2019-05-10 NOTE — ED Notes (Signed)
Patient c/o SOB, fever, diarrhea, N/V, altered taste.

## 2019-05-10 NOTE — ED Notes (Signed)
Called floor for update on room, still being cleaned at this time.

## 2019-05-10 NOTE — ED Notes (Signed)
Pt to go to floor (room 120) after room cleaned

## 2019-05-10 NOTE — ED Notes (Signed)
ED Provider at bedside. 

## 2019-05-10 NOTE — ED Notes (Signed)
Pt standing next bed, stating he is having anxiety and requesting to take Klonopin, secure message sent to admitting and new order received.  Pt also c/o ear ache and headache.  Informed patient that right now I do not know when a bed will be ready for him that we are waiting for discharges.  Pt verbalized understanding.  Pt given breakfast tray.

## 2019-05-10 NOTE — ED Notes (Signed)
Called pharmacy to request albuterol treatment be changed to inhaler per MD Larinda Buttery

## 2019-05-10 NOTE — ED Notes (Signed)
Transferring pt to room 120 via stretcher.

## 2019-05-10 NOTE — ED Notes (Signed)
Repeat troponin drawn at this time, pt had tray sitting outside of room, pt states it has been there for about an hour and half, apologized to patient and informed him that I did not know the tray had come, pt offered [place] sandwich tray and drink. Pt wanted pepsi and offered him shasta but declined.  Apologized again to patient and told him that since he would going up stairs soon that he could order off the menu.

## 2019-05-10 NOTE — ED Provider Notes (Addendum)
[facility] Emergency Department Provider Note   ____________________________________________   First MD Initiated Contact with Patient 05/10/19 0340     (approximate)  I have reviewed the triage vital signs and the nursing notes.   HISTORY  Chief Complaint Shortness of Breath    HPI George Richards is a 39 y.o. male with past medical history of hypertension presents to the ED complaining of shortness of breath.  Patient reports that he has been feeling "sick" for about the past week.  He describes subjective fevers, chills, cough, shortness of breath, nausea, vomiting, and diarrhea.  He is not aware of any recent sick contacts and has not recently been tested for COVID-19.  He denies any pain, including chest pain or abdominal pain.  He has not been taking anything for his symptoms at home.  He denies any significant shortness of breath while sitting at rest currently.  He also feels like his sense of taste and smell have been off, with water tasting Fraller than usual.        Past Medical History:  Diagnosis Date  . Anxiety   . History of kidney stones   . Hypertension   . 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 Myer Haff  . Lumbar radiculopathy, chronic    Followed by Dr. Myer Haff, has asymmetric disc herniation at L5-S1    There are no problems to display for this patient.   No past surgical history on file.  Prior to Admission medications   Medication Sig Start Date End Date Taking? Authorizing Provider  amLODipine (NORVASC) 10 MG tablet Take 10 mg by mouth daily.    [provider]  clonazePAM (KLONOPIN) 1 MG tablet Take 1 mg by mouth 2 (two) times daily.    [provider]  cyclobenzaprine (FLEXERIL) 5 MG tablet Take 1-2 tablets 3 times daily as needed 04/02/18   Enid Derry, PA-C  metoprolol succinate (TOPROL-XL) 50 MG 24 hr tablet Take 50 mg by mouth daily.  12/15/16   [provider]  ondansetron (ZOFRAN ODT) 4 MG disintegrating tablet Take 1 tablet (4 mg total) by mouth every 8 (eight) hours as needed. 08/01/18   Darci Current, MD  oxyCODONE-acetaminophen (PERCOCET) 5-325 MG tablet Take 2 tablets by mouth every 6 (six) hours as needed for severe pain. 06/26/18   Loleta Rose, MD  oxyCODONE-acetaminophen (PERCOCET) 5-325 MG tablet Take 1 tablet by mouth every 4 (four) hours as needed. 08/01/18 08/01/19  Darci Current, MD    Allergies Patient has no known allergies.  No family history on file.  Social History Social History   Tobacco Use  . Smoking status: Never Smoker  . Smokeless tobacco: Never Used  Substance Use Topics  . Alcohol use: No  . Drug use: No    Review of Systems  Constitutional: Positive for subjective fever/chills Eyes: No visual changes. ENT: No sore throat. Cardiovascular: Denies chest pain. Respiratory: Positive for cough and shortness of breath. Gastrointestinal: No abdominal pain.  Positive for nausea and vomiting.  Positive for diarrhea.  No constipation. Genitourinary: Negative for dysuria. Musculoskeletal: Negative for back pain. Skin: Negative for rash. Neurological: Negative for headaches, focal weakness or numbness.  ____________________________________________   PHYSICAL EXAM:  VITAL SIGNS: ED Triage Vitals  Enc Vitals Group     BP 05/10/19 0331 105/76     Pulse Rate 05/10/19 0331 93     Resp 05/10/19 0331 (!) 22  Temp 05/10/19 0331 (!) 102.3 F (39.1 C)     Temp Source 05/10/19 0331 Oral     SpO2 05/10/19 0331 92 %     Weight 05/10/19 0332 250 lb (113.4 kg)     Height 05/10/19 0332 5\' 10"  (1.778 m)     Head Circumference --      Peak Flow --      Pain Score --      Pain Loc --      Pain Edu? --      Excl. in [place]? --     Constitutional: Alert and oriented. Eyes: Conjunctivae are normal. Head: Atraumatic. Nose: No congestion/rhinnorhea. Mouth/Throat: Mucous membranes  are moist. Neck: Normal ROM Cardiovascular: Normal rate, regular rhythm. Grossly normal heart sounds. Respiratory: Normal respiratory effort.  No retractions. Lungs CTAB. Gastrointestinal: Soft and nontender. No distention. Genitourinary: deferred Musculoskeletal: No lower extremity tenderness nor edema. Neurologic:  Normal speech and language. No gross focal neurologic deficits are appreciated. Skin:  Skin is warm, dry and intact. No rash noted. Psychiatric: Mood and affect are normal. Speech and behavior are normal.  ____________________________________________   LABS (all labs ordered are listed, but only abnormal results are displayed)  Labs Reviewed  RESPIRATORY PANEL BY RT PCR (FLU A&B, COVID) - Abnormal; Notable for the following components:      Result Value   SARS Coronavirus 2 by RT PCR POSITIVE (*)    All other components within normal limits  CBC WITH DIFFERENTIAL/PLATELET - Abnormal; Notable for the following components:   MCV 78.9 (*)    All other components within normal limits  BASIC METABOLIC PANEL - Abnormal; Notable for the following components:   Potassium 3.3 (*)    Glucose, Bld 109 (*)    BUN 26 (*)    Creatinine, Ser 1.35 (*)    Calcium 8.3 (*)    All other components within normal limits  TROPONIN I (HIGH SENSITIVITY) - Abnormal; Notable for the following components:   Troponin I (High Sensitivity) 45 (*)    All other components within normal limits  CULTURE, BLOOD (ROUTINE X 2)  CULTURE, BLOOD (ROUTINE X 2)  LACTIC ACID, PLASMA  C-REACTIVE PROTEIN  FIBRIN DERIVATIVES D-DIMER ([facility] ONLY)  FERRITIN  POC SARS CORONAVIRUS 2 AG  TROPONIN I (HIGH SENSITIVITY)   ____________________________________________  EKG  ED ECG REPORT I, Blake Divine, the attending physician, personally viewed and interpreted this ECG.   Date: 05/10/2019  EKG Time: 3:44  Rate: 88  Rhythm: normal sinus rhythm  Axis: LAD  Intervals:none  ST&T Change:  LVH   PROCEDURES  Procedure(s) performed (including Critical Care):  .Critical Care Performed by: Blake Divine, MD Authorized by: Blake Divine, MD   Critical care provider statement:    Critical care time (minutes):  45   Critical care time was exclusive of:  Separately billable procedures and treating other patients and teaching time   Critical care was necessary to treat or prevent imminent or life-threatening deterioration of the following conditions:  Respiratory failure   Critical care was time spent personally by me on the following activities:  Discussions with consultants, evaluation of patient's response to treatment, examination of patient, ordering and performing treatments and interventions, ordering and review of laboratory studies, ordering and review of radiographic studies, pulse oximetry, re-evaluation of patient's condition, obtaining history from patient or surrogate and review of old charts   I assumed direction of critical care for this patient from another provider in my specialty: no  ____________________________________________   INITIAL IMPRESSION / ASSESSMENT AND PLAN / ED COURSE       39 year old male presents to the ED complaining of 1 week of fevers, cough, shortness of breath, vomiting, diarrhea, and changes in taste and smell.  I would have a high suspicion for Covid in this patient and bacterial sepsis seems less likely given he is overall well-appearing.  We will obtain blood cultures and lactate, but hold off on antibiotics for now.  EKG shows no acute ischemic changes and I have a low suspicion for cardiac etiology of his symptoms.  Plan to check chest x-ray and treat symptomatically with albuterol, Tylenol, Zofran, and IV fluids.  Chest x-ray shows multifocal opacities consistent with viral pneumonia and COVID-19, however if COVID-19 testing is negative we will need to treat with antibiotics.  Lab work thus far is remarkable for mild AKI as  well as mildly elevated troponin, will trend but I doubt ACS.  Patient noted to drop O2 sats to 86% when ambulating along with increased tachypnea.  Plan to admit for further management of suspected COVID-19.    ____________________________________________   FINAL CLINICAL IMPRESSION(S) / ED DIAGNOSES  Final diagnoses:  Acute respiratory failure with hypoxia (HCC)  Multifocal pneumonia     ED Discharge Orders    None       Note:  This document was prepared using Dragon voice recognition software and may include unintentional dictation errors.   Chesley Noon, MD 05/10/19 9892    Chesley Noon, MD 05/10/19 [phone]

## 2019-05-10 NOTE — ED Notes (Signed)
Patient ambulated with pulse oximetry monitor per MD request. Patient's oxygen saturation level decreased from 94% to 86% on RA. Patient's respiratory rate increased from 19 to 36. Patient placed back in bed.

## 2019-05-10 NOTE — H&P (Addendum)
History and Physical    George Richards [name] DOB: [birth year] DOA: 05/10/2019  Referring MD/NP/PA:   PCP: George Hire, MD   Patient coming from:  The patient is coming from home.  At baseline, pt is independent for most of ADL.        Chief Complaint: fever, SOB, nausea, vomiting, diarrhea  HPI: George Richards is a 39 y.o. male with medical history significant of hypertension, anxiety, kidney stone, chronic back pain, who presents with fever, shortness breath, nausea, vomiting, diarrhea.  Pt states that he has been feeling sick for about a week. He has fever, chills, generalized weakness, cough, shortness breath, nausea, vomiting, diarrhea.  He has had to 3 times of watery diarrhea each day.  No abdominal pain or chest pain.  No symptoms of UTI or unilateral weakness.  Patient states that his shortness breath has been progressively worsening. He also feels like his sense of taste and smell have been off, with water tasting Fraller than usual.    ED Course: pt was found to have positive COVID-19 PCR, WBC 4.7, troponin 45, 44, lactic acid 1.0, potassium 3.3, creatinine 1.35, BUN 26, GFR> 60, temperature 102.3, blood pressure 111/81, heart rate 77, oxygen saturation 89% on room air, 96% on 2 L nasal cannula oxygen.  Chest x-ray showed multifocal infiltration.  Patient is admitted to [facility] bed as inpatient.  Review of Systems:   General: has fevers, chills, no body weight gain, has poor appetite, has fatigue HEENT: no blurry vision, hearing changes or sore throat Respiratory: has dyspnea, coughing, no wheezing CV: no chest pain, no palpitations GI: has nausea, vomiting, diarrhea, no constipation abdominal pain, GU: no dysuria, burning on urination, increased urinary frequency, hematuria  Ext: no leg edema Neuro: no unilateral weakness, numbness, or tingling, no vision change or hearing loss Skin: no rash, no skin tear. MSK: No muscle spasm, no deformity, no limitation of range of  movement in spin Heme: No easy bruising.  Travel history: No recent long distant travel.  Allergy: No Known Allergies  Past Medical History:  Diagnosis Date  . Anxiety   . History of kidney stones   . Hypertension   . 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 Izora Richards  . Lumbar radiculopathy, chronic    Followed by Dr. Izora Richards, has asymmetric disc herniation at L5-S1    No past surgical history on file.  Social History:  reports that he has never smoked. He has never used smokeless tobacco. He reports that he does not drink alcohol or use drugs.  Family History: Reviewed with patient, but the patient states that all family members are healthy  Prior to Admission medications   Medication Sig Start Date End Date Taking? Authorizing Provider  amLODipine (NORVASC) 10 MG tablet Take 10 mg by mouth daily.    [provider]  clonazePAM (KLONOPIN) 1 MG tablet Take 1 mg by mouth 2 (two) times daily.    [provider]  cyclobenzaprine (FLEXERIL) 5 MG tablet Take 1-2 tablets 3 times daily as needed 04/02/18   George Emperor, PA-C  metoprolol succinate (TOPROL-XL) 50 MG 24 hr tablet Take 50 mg by mouth daily. 12/15/16   [provider]  ondansetron (ZOFRAN ODT) 4 MG disintegrating tablet Take 1 tablet (4 mg total) by mouth every 8 (eight) hours as needed. 08/01/18   George Hams, MD  oxyCODONE-acetaminophen (PERCOCET) 5-325 MG tablet Take 2 tablets by mouth  every 6 (six) hours as needed for severe pain. 06/26/18   George Rose, MD  oxyCODONE-acetaminophen (PERCOCET) 5-325 MG tablet Take 1 tablet by mouth every 4 (four) hours as needed. 08/01/18 08/01/19  George Current, MD    Physical Exam: Vitals:   05/10/19 0615 05/10/19 0625 05/10/19 0700 05/10/19 0730  BP:  111/81 108/85 102/79  Pulse: 69 77 64 60  Resp: (!) 22 (!) 22 (!) 21 19  Temp:      TempSrc:      SpO2: 94% 96% 97% 95%  Weight:        Height:       General: Not in acute distress HEENT:       Eyes: PERRL, EOMI, no scleral icterus.       ENT: No discharge from the ears and nose, no pharynx injection, no tonsillar enlargement.        Neck: No JVD, no bruit, no mass felt. Heme: No neck lymph node enlargement. Cardiac: S1/S2, RRR, No murmurs, No gallops or rubs. Respiratory: No rales, wheezing, rhonchi or rubs. GI: Soft, nondistended, nontender, no rebound pain, no organomegaly, BS present. GU: No hematuria Ext: No pitting leg edema bilaterally. 2+DP/PT pulse bilaterally. Musculoskeletal: No joint deformities, No joint redness or warmth, no limitation of ROM in spin. Skin: No rashes.  Neuro: Alert, oriented X3, cranial nerves II-XII grossly intact, moves all extremities normally. Psych: Patient is not psychotic, no suicidal or hemocidal ideation.  Labs on Admission: I have personally reviewed following labs and imaging studies  CBC: Recent Labs  Lab 05/10/19 0355  WBC 4.7  NEUTROABS 3.0  HGB 14.0  HCT 40.8  MCV 78.9*  PLT 164   Basic Metabolic Panel: Recent Labs  Lab 05/10/19 0355  NA 138  K 3.3*  CL 103  CO2 26  GLUCOSE 109*  BUN 26*  CREATININE 1.35*  CALCIUM 8.3*   GFR: Estimated Creatinine Clearance: 91.8 mL/min (A) (by C-G formula based on SCr of 1.35 mg/dL (H)). Liver Function Tests: No results for input(s): AST, ALT, ALKPHOS, BILITOT, PROT, ALBUMIN in the last 168 hours. No results for input(s): LIPASE, AMYLASE in the last 168 hours. No results for input(s): AMMONIA in the last 168 hours. Coagulation Profile: No results for input(s): INR, PROTIME in the last 168 hours. Cardiac Enzymes: No results for input(s): CKTOTAL, CKMB, CKMBINDEX, TROPONINI in the last 168 hours. BNP (last 3 results) No results for input(s): PROBNP in the last 8760 hours. HbA1C: No results for input(s): HGBA1C in the last 72 hours. CBG: No results for input(s): GLUCAP in the last 168 hours. Lipid Profile: No  results for input(s): CHOL, HDL, LDLCALC, TRIG, CHOLHDL, LDLDIRECT in the last 72 hours. Thyroid Function Tests: No results for input(s): TSH, T4TOTAL, FREET4, T3FREE, THYROIDAB in the last 72 hours. Anemia Panel: Recent Labs    05/10/19 0355  FERRITIN 1,184*   Urine analysis:    Component Value Date/Time   COLORURINE AMBER (A) 08/01/2018 0228   APPEARANCEUR CLEAR (A) 08/01/2018 0228   APPEARANCEUR Clear 10/14/2012 1140   LABSPEC 1.018 08/01/2018 0228   LABSPEC 1.016 10/14/2012 1140   PHURINE 7.0 08/01/2018 0228   GLUCOSEU 50 (A) 08/01/2018 0228   GLUCOSEU Negative 10/14/2012 1140   HGBUR SMALL (A) 08/01/2018 0228   BILIRUBINUR NEGATIVE 08/01/2018 0228   BILIRUBINUR Negative 10/14/2012 1140   KETONESUR 20 (A) 08/01/2018 0228   PROTEINUR 30 (A) 08/01/2018 0228   NITRITE NEGATIVE 08/01/2018 0228   LEUKOCYTESUR NEGATIVE 08/01/2018 0228  LEUKOCYTESUR Negative 10/14/2012 1140   Sepsis Labs: @LABRCNTIP (procalcitonin:4,lacticidven:4) ) Recent Results (from the past 240 hour(s))  Culture, blood (routine x 2)     Status: None (Preliminary result)   Collection Time: 05/10/19  3:55 AM   Specimen: BLOOD  Result Value Ref Range Status   Specimen Description BLOOD RIGHT HAND  Final   Special Requests   Final    BOTTLES DRAWN AEROBIC AND ANAEROBIC Blood Culture adequate volume   Culture   Final    NO GROWTH < 12 HOURS Performed at [facility], [address] [place], [place]    Report Status PENDING  Incomplete  Respiratory Panel by RT PCR (Flu A&B, Covid) - Nasopharyngeal Swab     Status: Abnormal   Collection Time: 05/10/19  4:43 AM   Specimen: Nasopharyngeal Swab  Result Value Ref Range Status   SARS Coronavirus 2 by RT PCR POSITIVE (A) NEGATIVE Final    Comment: RESULT CALLED TO, READ BACK BY AND VERIFIED WITH: JENNA ELLINGTON 05/10/19 AT 0605 HS    Influenza A by PCR NEGATIVE NEGATIVE Final   Influenza B by PCR NEGATIVE NEGATIVE Final    Comment:  (NOTE) The Xpert Xpress SARS-CoV-2/FLU/RSV assay is intended as an aid in  the diagnosis of influenza from Nasopharyngeal swab specimens and  should not be used as a sole basis for treatment. Nasal washings and  aspirates are unacceptable for Xpert Xpress SARS-CoV-2/FLU/RSV  testing. Fact Sheet for Patients: 05/12/19 Fact Sheet for Healthcare Providers: https://www.moore.com/ This test is not yet approved or cleared by the https://www.young.biz/ FDA and  has been authorized for detection and/or diagnosis of SARS-CoV-2 by  FDA under an Emergency Use Authorization (EUA). This EUA will remain  in effect (meaning this test can be used) for the duration of the  Covid-19 declaration under Section 564(b)(1) of the Act, 21  U.S.C. section 360bbb-3(b)(1), unless the authorization is  terminated or revoked. Performed at [facility], [address] [place], [place]   Culture, blood (routine x 2)     Status: None (Preliminary result)   Collection Time: 05/10/19  5:05 AM   Specimen: BLOOD  Result Value Ref Range Status   Specimen Description BLOOD RIGHT ANTECUBITAL  Final   Special Requests   Final    BOTTLES DRAWN AEROBIC AND ANAEROBIC Blood Culture results may not be optimal due to an excessive volume of blood received in culture bottles   Culture   Final    NO GROWTH <12 HOURS Performed at [facility], [address] [place], [place]    Report Status PENDING  Incomplete     Radiological Exams on Admission: DG Chest Portable 1 View  Result Date: 05/10/2019 CLINICAL DATA:  Shortness of breath EXAM: PORTABLE CHEST 1 VIEW COMPARISON:  None. FINDINGS: Low lung volumes. Mild multifocal patchy opacities in the right upper lobe, lingula, and right lower lobe. This appearance is suspicious for multifocal pneumonia. No pleural effusion or pneumothorax. Heart is normal in size. IMPRESSION: Multifocal pneumonia.  Electronically Signed   By: 05/12/2019 M.D.   On: 05/10/2019 04:08     EKG: Reviewed independently, sinus rhythm, QTC 437, TV inversion only in lead III, nonspecific T wave change  Assessment/Plan Principal Problem:   Pneumonia due to COVID-19 virus Active Problems:   Hypokalemia   Lumbar radiculopathy, chronic   Hypertension   Elevated troponin   Anxiety   Nausea vomiting and diarrhea   Pneumonia due to  COVID-19 virus: Patient has oxygen desaturation to 89% on room air, 92% on 2 L nasal cannula oxygen.  Chest x-ray showed multifocal infiltration.  -will admit to med-surg bed as inpt -Remdesivir per pharm -->need to f/u LFT -Solumedrol 60 mg bid -vitamin C, zinc.  -Bronchodilators -PRN Mucinex for cough -f/u Blood culture -Gentle IV fluid: received 1L of LR -D-dimer, BNP,Trop, LFT, CRP, LDH, Procalcitonin, Ferritin, fibinogen, TG, Hep B SAg, HIV ab -Daily CRP, Ferritin, D-dimer, -Will ask the patient to maintain an awake prone position for 16+ hours a day, if possible, with a minimum of 2-3 hours at a time -Will attempt to maintain euvolemia to a net negative fluid status -IF patient deteriorates, will consult PCCM and ID  Hypokalemia: K= 3.3 on admission. - Repleted - Check Mg level  Lumbar radiculopathy, chronic: -continue home percocet prn -Flexeril  HTN: Blood pressure 111/81 -Continue home medications: Metoprolol -Hold amlodipine -hydralazine prn  Elevated troponin: Troponin 45, 44.  No chest pain.  Most likely due to COVID-19 infection -Trend troponin -Check A1c, FLP -Repeat EKG in morning  Anxiety: Continue home Klonopin  Nausea vomiting and diarrhea: No abdominal pain most likely due to COVID-19 infection. -Supportive care -As needed Imodium -Prn zofran   Inpatient status:  # Patient requires inpatient status due to high intensity of service, high risk for further deterioration and high frequency of surveillance required.  I certify that  at the point of admission it is my clinical judgment that the patient will require inpatient hospital care spanning beyond 2 midnights from the point of admission.  . This patient has multiple chronic comorbidities including hypertension, anxiety, kidney stone, chronic back pain. . Now patient has presenting with pneumonia due to COVID-19 virus with new oxygen requirement, also has elevated troponin, nausea, vomiting, diarrhea . The initial radiographic and laboratory data are worrisome because of multifocal infiltration on chest x-ray, hypokalemia, elevated troponin . Richards medical needs: please see my assessment and plan . Predictability of an adverse outcome (risk): Patient has multiple comorbidities as listed above. Now presents with pneumonia due to COVID-19 virus with new oxygen requirement, also has elevated troponin, nausea, vomiting, diarrhea. Patient's presentation is highly complicated.  Patient is at high risk of deteriorating.  Will need to be treated in hospital for at least 2 days.       DVT ppx: SQ Lovenox Code Status: Full code Family Communication: not done, no family member is at bed side. Disposition Plan:  Anticipate discharge back to previous home environment Consults called:  none Admission status: Med-surg bed as inpt     Date of Service 05/10/2019    Lorretta Harp [facility]   If 7PM-7AM, please contact night-coverage [email or web address] 05/10/2019, 8:16 AM

## 2019-05-11 DIAGNOSIS — J9601 Acute respiratory failure with hypoxia: Secondary | ICD-10-CM

## 2019-05-11 DIAGNOSIS — I10 Essential (primary) hypertension: Secondary | ICD-10-CM

## 2019-05-11 LAB — CBC WITH DIFFERENTIAL/PLATELET
Abs Immature Granulocytes: 0.04 10*3/uL (ref 0.00–0.07)
Basophils Absolute: 0 10*3/uL (ref 0.0–0.1)
Basophils Relative: 0 %
Eosinophils Absolute: 0 10*3/uL (ref 0.0–0.5)
Eosinophils Relative: 0 %
HCT: 41.8 % (ref 39.0–52.0)
Hemoglobin: 14.2 g/dL (ref 13.0–17.0)
Immature Granulocytes: 1 %
Lymphocytes Relative: 18 %
Lymphs Abs: 1 10*3/uL (ref 0.7–4.0)
MCH: 27.2 pg (ref 26.0–34.0)
MCHC: 34 g/dL (ref 30.0–36.0)
MCV: 80.1 fL (ref 80.0–100.0)
Monocytes Absolute: 0.3 10*3/uL (ref 0.1–1.0)
Monocytes Relative: 5 %
Neutro Abs: 4.4 10*3/uL (ref 1.7–7.7)
Neutrophils Relative %: 76 %
Platelets: 200 10*3/uL (ref 150–400)
RBC: 5.22 MIL/uL (ref 4.22–5.81)
RDW: 13.6 % (ref 11.5–15.5)
WBC: 5.7 10*3/uL (ref 4.0–10.5)
nRBC: 0 % (ref 0.0–0.2)

## 2019-05-11 LAB — COMPREHENSIVE METABOLIC PANEL
ALT: 65 U/L — ABNORMAL HIGH (ref 0–44)
AST: 84 U/L — ABNORMAL HIGH (ref 15–41)
Albumin: 3.5 g/dL (ref 3.5–5.0)
Alkaline Phosphatase: 49 U/L (ref 38–126)
Anion gap: 7 (ref 5–15)
BUN: 29 mg/dL — ABNORMAL HIGH (ref 6–20)
CO2: 28 mmol/L (ref 22–32)
Calcium: 8.7 mg/dL — ABNORMAL LOW (ref 8.9–10.3)
Chloride: 105 mmol/L (ref 98–111)
Creatinine, Ser: 1.03 mg/dL (ref 0.61–1.24)
GFR calc Af Amer: >60 mL/min (ref >60)
GFR calc non Af Amer: >60 mL/min (ref >60)
Glucose, Bld: 150 mg/dL — ABNORMAL HIGH (ref 70–99)
Potassium: 4.3 mmol/L (ref 3.5–5.1)
Sodium: 140 mmol/L (ref 135–145)
Total Bilirubin: 0.6 mg/dL (ref 0.3–1.2)
Total Protein: 6.8 g/dL (ref 6.5–8.1)

## 2019-05-11 LAB — LIPID PANEL
Cholesterol: 110 mg/dL (ref 0–200)
HDL: 31 mg/dL — ABNORMAL LOW (ref >40)
LDL Cholesterol: 67 mg/dL (ref 0–99)
Total CHOL/HDL Ratio: 3.5 RATIO
Triglycerides: 61 mg/dL (ref <150)
VLDL: 12 mg/dL (ref 0–40)

## 2019-05-11 LAB — C-REACTIVE PROTEIN: CRP: 0.8 mg/dL (ref <1.0)

## 2019-05-11 LAB — FIBRIN DERIVATIVES D-DIMER (ARMC ONLY): Fibrin derivatives D-dimer (ARMC): 779.13 ng/mL (FEU) — ABNORMAL HIGH (ref 0.00–499.00)

## 2019-05-11 LAB — MAGNESIUM: Magnesium: 2.3 mg/dL (ref 1.7–2.4)

## 2019-05-11 LAB — FERRITIN: Ferritin: 1022 ng/mL — ABNORMAL HIGH (ref 24–336)

## 2019-05-11 MED ORDER — ALBUTEROL SULFATE HFA 108 (90 BASE) MCG/ACT IN AERS: 2.0000 | INHALATION_SPRAY | RESPIRATORY_TRACT | 0 refills | Status: DC | PRN

## 2019-05-11 MED ORDER — PREDNISONE 50 MG PO TABS: 50.0000 mg | ORAL_TABLET | Freq: Every day | ORAL | Status: DC

## 2019-05-11 MED ORDER — METOPROLOL SUCCINATE ER 25 MG PO TB24: 50.0000 mg | ORAL_TABLET | Freq: Every day | ORAL | 0 refills | Status: DC

## 2019-05-11 MED ORDER — PREDNISONE 10 MG PO TABS: ORAL_TABLET | ORAL | 0 refills | Status: DC

## 2019-05-11 MED ORDER — CLONAZEPAM 1 MG PO TABS: 1.0000 mg | ORAL_TABLET | Freq: Two times a day (BID) | ORAL | 0 refills | Status: AC | PRN

## 2019-05-11 NOTE — Progress Notes (Signed)
Pt called out needing a bedding change. Changed pt's linens.Pt asked to take a shower, educated pt that Dr.'s order was needed in order to shower. Pt ignored this nurse and took a shower anyway.

## 2019-05-11 NOTE — Progress Notes (Signed)
Patient was 89%-90% laying in bed on RA. While walking patient dropped to 88% briefly but recovered quickly mostly stayed at 90-92% while walking on RA. When back in bed O2 was 90%-93%, quick drops to 89%. Currently 94% on RA.

## 2019-05-11 NOTE — Discharge Instructions (Addendum)
You are scheduled for an outpatient infusion of Remdesivir at 8:30 AM on Monday 3/15, Tuesday 3/16 and Wednesday 3/17.  Please report to [facility] at [address]  Drive to the security guard and tell them you are here for an infusion. They will direct you to the front entrance where we will come and get you.  For questions call [phone].  Thanks

## 2019-05-11 NOTE — Discharge Summary (Signed)
[facility] NAME: George Richards    MR#:  [id number]  DATE OF BIRTH:  1980-09-22  DATE OF ADMISSION:  05/10/2019 ADMITTING PHYSICIAN: Ivor Costa, MD  DATE OF DISCHARGE: 05/11/2019  PRIMARY CARE PHYSICIAN: Baxter Hire, MD    ADMISSION DIAGNOSIS:  Acute respiratory failure with hypoxia ([place]) [J96.01] Multifocal pneumonia [J18.9] Pneumonia due to COVID-19 virus [U07.1, J12.82]  DISCHARGE DIAGNOSIS:  Pneumonia due to COVID-19 virus   SECONDARY DIAGNOSIS:   Past Medical History:  Diagnosis Date  . Anxiety   . History of kidney stones   . Hypertension   . 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 Izora Ribas  . Lumbar radiculopathy, chronic    Followed by Dr. Izora Ribas, has asymmetric disc herniation at L5-S1    HOSPITAL COURSE:   George Richards is a 39 y.o. male with medical history significant of hypertension, anxiety, kidney stone, chronic back pain, who presents with fever, shortness breath, nausea, vomiting, diarrhea. Pt states that he has been feeling sick for about a week.  Pneumonia due to COVID-19 virus: Patient has oxygen desaturation to 89% on room air, 92% on 2 L nasal cannula oxygen.  Chest x-ray showed multifocal infiltration. -Remdesivir ( 2/5)per pharm --> f/u LFT's trending down -Solumedrol 60 mg bid--change to po prednisone taper -vitamin C, zinc.  -Bronchodilators -PRN Mucinex for cough -f/u Blood culture so far negative -received 1L of LR -D-dimer 779, BNP 24, CRP pending , LDH 365, Procalcitonin <0.10,  Ferritin 1022  -Will ask the patient to maintain an awake prone position while sleeping -patient overall feels better. -His saturations remain anywhere from 90 to 94% on room air and ambulation. He transiently/occasionally drops into 8889 however covers very quickly. Does not have any underlying lung problems. No history of tobacco abuse. -He is not  in respiratory distress or tachypnea. -Patient wishes to go home and finish up outpatient IV infusion at [facility]. Appointment has been set up for remaining three doses-- that is Monday Tuesday and Wednesday.  Hypokalemia: K= 3.3 on admission. - Repleted - Mg level 2.3  Lumbar radiculopathy, chronic: -continue home percocet prn -Flexeril  HTN: Blood pressure 134/93 -Continue home medications: Metoprolol  Elevated troponin: Troponin 45, 44.  No chest pain.  Most likely due to COVID-19 infection -no h/o CAD  Anxiety: Continue home Klonopin  Nausea vomiting and diarrhea: No abdominal pain most likely due to COVID-19 infection. -Supportive care -As needed Imodium -Prn zofran  Pt is advised to follow up with his primary care physician Dr. Harrel Lemon in 7 to 10 days. He'll make appointment. He is asked to return to the emergency room if his symptoms worsened. Patient voiced understanding. I asked him whether I need to talk with his wife to update him he said his wife is aware.  CONSULTS OBTAINED:    DRUG ALLERGIES:  No Known Allergies  DISCHARGE MEDICATIONS:   Allergies as of 05/11/2019   No Known Allergies     Medication List    TAKE these medications   albuterol 108 (90 Base) MCG/ACT inhaler Commonly known as: VENTOLIN HFA Inhale 2 puffs into the lungs every 4 (four) hours as needed for wheezing or shortness of breath.   clonazePAM 1 MG tablet Commonly known as: KLONOPIN Take 1 tablet (1 mg total) by mouth 2 (two) times daily as needed for anxiety.   metoprolol succinate 25 MG 24 hr tablet  Commonly known as: TOPROL-XL Take 2 tablets (50 mg total) by mouth daily. Take with or immediately following a meal. Start taking on: May 12, 2019 What changed:   medication strength  additional instructions   predniSONE 10 MG tablet Commonly known as: DELTASONE Take 50 mg daily --taper by 10 mg daily then stop Start taking on: May 12, 2019       If  you experience worsening of your admission symptoms, develop shortness of breath, life threatening emergency, suicidal or homicidal thoughts you must seek medical attention immediately by calling 911 or calling your MD immediately  if symptoms less severe.  You Must read complete instructions/literature along with all the possible adverse reactions/side effects for all the Medicines you take and that have been prescribed to you. Take any new Medicines after you have completely understood and accept all the possible adverse reactions/side effects.   Please note  You were cared for by a hospitalist during your hospital stay. If you have any questions about your discharge medications or the care you received while you were in the hospital after you are discharged, you can call the unit and asked to speak with the hospitalist on call if the hospitalist that took care of you is not available. Once you are discharged, your primary care physician will handle any further medical issues. Please note that NO REFILLS for any discharge medications will be authorized once you are discharged, as it is imperative that you return to your primary care physician (or establish a relationship with a primary care physician if you do not have one) for your aftercare needs so that they can reassess your need for medications and monitor your lab values. Today   SUBJECTIVE   Patient came in with increasing shortness of breath, diarrhea and overall malaise. He denies any complaints. Appetite is not the best--however feels a bit better today No respiratory distress  He wants to go home after finishing today's IV infusion.  VITAL SIGNS:  Blood pressure (!) 134/93, pulse 62, temperature 98.3 F (36.8 C), temperature source Oral, resp. rate 16, height 5\' 10"  (1.778 m), weight 113.4 kg, SpO2 93 %.  I/O:    Intake/Output Summary (Last 24 hours) at 05/11/2019 1310 Last data filed at 05/11/2019 0400 Gross per 24 hour  Intake  755.35 ml  Output --  Net 755.35 ml    PHYSICAL EXAMINATION:  GENERAL:  39 y.o.-year-old patient lying in the bed with no acute distress.  EYES: Pupils equal, round, reactive to light and accommodation. No scleral icterus.  HEENT: Head atraumatic, normocephalic. Oropharynx and nasopharynx clear.  NECK:  Supple, no jugular venous distention. No thyroid enlargement, no tenderness.  LUNGS: Normal breath sounds bilaterally, no wheezing, rales,occ rhonchi , no crepitation. No use of accessory muscles of respiration.  CARDIOVASCULAR: S1, S2 normal. No murmurs, rubs, or gallops.  ABDOMEN: Soft, non-tender, non-distended. Bowel sounds present. No organomegaly or mass.  EXTREMITIES: No pedal edema, cyanosis, or clubbing.  NEUROLOGIC: Cranial nerves II through XII are intact. Muscle strength 5/5 in all extremities. Sensation intact. Gait not checked.  PSYCHIATRIC: The patient is alert and oriented x 3.  SKIN: No obvious rash, lesion, or ulcer.   DATA REVIEW:   CBC  Recent Labs  Lab 05/11/19 0704  WBC 5.7  HGB 14.2  HCT 41.8  PLT 200    Chemistries  Recent Labs  Lab 05/11/19 0704  NA 140  K 4.3  CL 105  CO2 28  GLUCOSE 150*  BUN  29*  CREATININE 1.03  CALCIUM 8.7*  MG 2.3  AST 84*  ALT 65*  ALKPHOS 49  BILITOT 0.6    Microbiology Results   Recent Results (from the past 240 hour(s))  Culture, blood (routine x 2)     Status: None (Preliminary result)   Collection Time: 05/10/19  3:55 AM   Specimen: BLOOD  Result Value Ref Range Status   Specimen Description BLOOD RIGHT HAND  Final   Special Requests   Final    BOTTLES DRAWN AEROBIC AND ANAEROBIC Blood Culture adequate volume   Culture   Final    NO GROWTH 1 DAY Performed at [facility], [address] [place], [place] [address]    Report Status PENDING  Incomplete  Respiratory Panel by RT PCR (Flu A&B, Covid) - Nasopharyngeal Swab     Status: Abnormal   Collection Time: 05/10/19  4:43 AM   Specimen:  Nasopharyngeal Swab  Result Value Ref Range Status   SARS Coronavirus 2 by RT PCR POSITIVE (A) NEGATIVE Final    Comment: RESULT CALLED TO, READ BACK BY AND VERIFIED WITH: JENNA ELLINGTON 05/10/19 AT 0605 HS    Influenza A by PCR NEGATIVE NEGATIVE Final   Influenza B by PCR NEGATIVE NEGATIVE Final    Comment: (NOTE) The Xpert Xpress SARS-CoV-2/FLU/RSV assay is intended as an aid in  the diagnosis of influenza from Nasopharyngeal swab specimens and  should not be used as a sole basis for treatment. Nasal washings and  aspirates are unacceptable for Xpert Xpress SARS-CoV-2/FLU/RSV  testing. Fact Sheet for Patients: https://www.moore.com/ Fact Sheet for Healthcare Providers: https://www.young.biz/ This test is not yet approved or cleared by the [place] FDA and  has been authorized for detection and/or diagnosis of SARS-CoV-2 by  FDA under an Emergency Use Authorization (EUA). This EUA will remain  in effect (meaning this test can be used) for the duration of the  Covid-19 declaration under Section 564(b)(1) of the Act, 21  U.S.C. section 360bbb-3(b)(1), unless the authorization is  terminated or revoked. Performed at [facility], [address] [place], [place] [address]   Culture, blood (routine x 2)     Status: None (Preliminary result)   Collection Time: 05/10/19  5:05 AM   Specimen: BLOOD  Result Value Ref Range Status   Specimen Description BLOOD RIGHT ANTECUBITAL  Final   Special Requests   Final    BOTTLES DRAWN AEROBIC AND ANAEROBIC Blood Culture results may not be optimal due to an excessive volume of blood received in culture bottles   Culture   Final    NO GROWTH 1 DAY Performed at [facility], [address], [place], [place] [address]    Report Status PENDING  Incomplete    RADIOLOGY:  DG Chest Portable 1 View  Result Date: 05/10/2019 CLINICAL DATA:  Shortness of breath EXAM: PORTABLE CHEST 1 VIEW  COMPARISON:  None. FINDINGS: Low lung volumes. Mild multifocal patchy opacities in the right upper lobe, lingula, and right lower lobe. This appearance is suspicious for multifocal pneumonia. No pleural effusion or pneumothorax. Heart is normal in size. IMPRESSION: Multifocal pneumonia. Electronically Signed   By: Charline Bills M.D.   On: 05/10/2019 04:08     CODE STATUS:     Code Status Orders  (From admission, onward)         Start     Ordered   05/10/19 0809  Full code  Continuous     05/10/19 0810  Code Status History    This patient has a current code status but no historical code status.   Advance Care Planning Activity       TOTAL TIME TAKING CARE OF THIS PATIENT: 40 minutes.    Enedina Finner M.D  [facility]    CC: Primary care physician; Gracelyn Nurse, MD

## 2019-05-11 NOTE — Progress Notes (Addendum)
Patient scheduled for outpatient Remdesivir infusion at 8:30 AM on Monday 3/15, Tuesday 3/16 and Wednesday 3/17.  Please advise them to report to [facility] at [address].  Drive to the security guard and tell them you are here for an infusion. They will direct you to the front entrance where we will come and get you.  For questions call [phone].  Thanks

## 2019-05-12 ENCOUNTER — Ambulatory Visit (HOSPITAL_COMMUNITY)
Admission: RE | Admit: 2019-05-12 | Discharge: 2019-05-12 | Disposition: A | Discharge status: Home / Self Care | Payer: 59 | Source: Ambulatory Visit | Attending: Pulmonary Disease | Admitting: Pulmonary Disease

## 2019-05-12 DIAGNOSIS — Z20822 Contact with and (suspected) exposure to covid-19: Secondary | ICD-10-CM | POA: Diagnosis not present

## 2019-05-12 LAB — HEMOGLOBIN A1C
Hgb A1c MFr Bld: 5.7 % — ABNORMAL HIGH (ref 4.8–5.6)
Mean Plasma Glucose: 116.89 mg/dL

## 2019-05-12 MED ORDER — SODIUM CHLORIDE 0.9 % IV SOLN: INTRAVENOUS | Status: DC | PRN

## 2019-05-12 MED ORDER — FAMOTIDINE IN NACL 20-0.9 MG/50ML-% IV SOLN: 20.0000 mg | Freq: Once | INTRAVENOUS | Status: DC | PRN

## 2019-05-12 MED ORDER — METHYLPREDNISOLONE SODIUM SUCC 125 MG IJ SOLR: 125.0000 mg | Freq: Once | INTRAMUSCULAR | Status: DC | PRN

## 2019-05-12 MED ORDER — ALBUTEROL SULFATE HFA 108 (90 BASE) MCG/ACT IN AERS: 2.0000 | INHALATION_SPRAY | Freq: Once | RESPIRATORY_TRACT | Status: DC | PRN

## 2019-05-12 MED ORDER — DIPHENHYDRAMINE HCL 50 MG/ML IJ SOLN: 50.0000 mg | Freq: Once | INTRAMUSCULAR | Status: DC | PRN

## 2019-05-12 MED ORDER — EPINEPHRINE 0.3 MG/0.3ML IJ SOAJ: 0.3000 mg | Freq: Once | INTRAMUSCULAR | Status: DC | PRN

## 2019-05-12 MED ORDER — SODIUM CHLORIDE 0.9 % IV SOLN
100.0000 mg | Freq: Once | INTRAVENOUS | Status: AC
  Administered 2019-05-12: 100 mg via INTRAVENOUS
  Filled 2019-05-12: qty 20

## 2019-05-12 NOTE — Progress Notes (Signed)
  Diagnosis: COVID-19  Physician:dr wright  Procedure: Covid Infusion Clinic Med: remdesivir infusion.  Complications: No immediate complications noted.  Discharge: Discharged home   George Richards S George Richards 05/12/2019

## 2019-05-12 NOTE — Discharge Instructions (Signed)
COVID-19 COVID-19 is a respiratory infection that is caused by a virus called severe acute respiratory syndrome coronavirus 2 (SARS-CoV-2). The disease is also known as coronavirus disease or novel coronavirus. In some people, the virus may not cause any symptoms. In others, it may cause a serious infection. The infection can get worse quickly and can lead to complications, such as:  Pneumonia, or infection of the lungs.  Acute respiratory distress syndrome or ARDS. This is a condition in which fluid build-up in the lungs prevents the lungs from filling with air and passing oxygen into the blood.  Acute respiratory failure. This is a condition in which there is not enough oxygen passing from the lungs to the body or when carbon dioxide is not passing from the lungs out of the body.  Sepsis or septic shock. This is a serious bodily reaction to an infection.  Blood clotting problems.  Secondary infections due to bacteria or fungus.  Organ failure. This is when your body's organs stop working. The virus that causes COVID-19 is contagious. This means that it can spread from person to person through droplets from coughs and sneezes (respiratory secretions). What are the causes? This illness is caused by a virus. You may catch the virus by:  Breathing in droplets from an infected person. Droplets can be spread by a person breathing, speaking, singing, coughing, or sneezing.  Touching something, like a table or a doorknob, that was exposed to the virus (contaminated) and then touching your mouth, nose, or eyes. What increases the risk? Risk for infection You are more likely to be infected with this virus if you:  Are within 6 feet (2 meters) of a person with COVID-19.  Provide care for or live with a person who is infected with COVID-19.  Spend time in crowded indoor spaces or live in shared housing. Risk for serious illness You are more likely to become seriously ill from the virus if you:   Are 50 years of age or older. The higher your age, the more you are at risk for serious illness.  Live in a nursing home or long-term care facility.  Have cancer.  Have a long-term (chronic) disease such as: ? Chronic lung disease, including chronic obstructive pulmonary disease or asthma. ? A long-term disease that lowers your body's ability to fight infection (immunocompromised). ? Heart disease, including heart failure, a condition in which the arteries that lead to the heart become narrow or blocked (coronary artery disease), a disease which makes the heart muscle thick, weak, or stiff (cardiomyopathy). ? Diabetes. ? Chronic kidney disease. ? Sickle cell disease, a condition in which red blood cells have an abnormal "sickle" shape. ? Liver disease.  Are obese. What are the signs or symptoms? Symptoms of this condition can range from mild to severe. Symptoms may appear any time from 2 to 14 days after being exposed to the virus. They include:  A fever or chills.  A cough.  Difficulty breathing.  Headaches, body aches, or muscle aches.  Runny or stuffy (congested) nose.  A sore throat.  New loss of taste or smell. Some people may also have stomach problems, such as nausea, vomiting, or diarrhea. Other people may not have any symptoms of COVID-19. How is this diagnosed? This condition may be diagnosed based on:  Your signs and symptoms, especially if: ? You live in an area with a COVID-19 outbreak. ? You recently traveled to or from an area where the virus is common. ? You   provide care for or live with a person who was diagnosed with COVID-19. ? You were exposed to a person who was diagnosed with COVID-19.  A physical exam.  Lab tests, which may include: ? Taking a sample of fluid from the back of your nose and throat (nasopharyngeal fluid), your nose, or your throat using a swab. ? A sample of mucus from your lungs (sputum). ? Blood tests.  Imaging tests, which  may include, X-rays, CT scan, or ultrasound. How is this treated? At present, there is no medicine to treat COVID-19. Medicines that treat other diseases are being used on a trial basis to see if they are effective against COVID-19. Your health care provider will talk with you about ways to treat your symptoms. For most people, the infection is mild and can be managed at home with rest, fluids, and over-the-counter medicines. Treatment for a serious infection usually takes places in a hospital intensive care unit (ICU). It may include one or more of the following treatments. These treatments are given until your symptoms improve.  Receiving fluids and medicines through an IV.  Supplemental oxygen. Extra oxygen is given through a tube in the nose, a face mask, or a hood.  Positioning you to lie on your stomach (prone position). This makes it easier for oxygen to get into the lungs.  Continuous positive airway pressure (CPAP) or bi-level positive airway pressure (BPAP) machine. This treatment uses mild air pressure to keep the airways open. A tube that is connected to a motor delivers oxygen to the body.  Ventilator. This treatment moves air into and out of the lungs by using a tube that is placed in your windpipe.  Tracheostomy. This is a procedure to create a hole in the neck so that a breathing tube can be inserted.  Extracorporeal membrane oxygenation (ECMO). This procedure gives the lungs a chance to recover by taking over the functions of the heart and lungs. It supplies oxygen to the body and removes carbon dioxide. Follow these instructions at home: Lifestyle  If you are sick, stay home except to get medical care. Your health care provider will tell you how long to stay home. Call your health care provider before you go for medical care.  Rest at home as told by your health care provider.  Do not use any products that contain nicotine or tobacco, such as cigarettes, e-cigarettes, and  chewing tobacco. If you need help quitting, ask your health care provider.  Return to your normal activities as told by your health care provider. Ask your health care provider what activities are safe for you. General instructions  Take over-the-counter and prescription medicines only as told by your health care provider.  Drink enough fluid to keep your urine pale yellow.  Keep all follow-up visits as told by your health care provider. This is important. How is this prevented?  There is no vaccine to help prevent COVID-19 infection. However, there are steps you can take to protect yourself and others from this virus. To protect yourself:   Do not travel to areas where COVID-19 is a risk. The areas where COVID-19 is reported change often. To identify high-risk areas and travel restrictions, check the CDC travel website: wwwnc.cdc.gov/travel/notices  If you live in, or must travel to, an area where COVID-19 is a risk, take precautions to avoid infection. ? Stay away from people who are sick. ? Wash your hands often with soap and water for 20 seconds. If soap and water   are not available, use an alcohol-based hand sanitizer. ? Avoid touching your mouth, face, eyes, or nose. ? Avoid going out in public, follow guidance from your state and local health authorities. ? If you must go out in public, wear a cloth face covering or face mask. Make sure your mask covers your nose and mouth. ? Avoid crowded indoor spaces. Stay at least 6 feet (2 meters) away from others. ? Disinfect objects and surfaces that are frequently touched every day. This may include:  Counters and tables.  Doorknobs and light switches.  Sinks and faucets.  Electronics, such as phones, remote controls, keyboards, computers, and tablets. To protect others: If you have symptoms of COVID-19, take steps to prevent the virus from spreading to others.  If you think you have a COVID-19 infection, contact your health care  provider right away. Tell your health care team that you think you may have a COVID-19 infection.  Stay home. Leave your house only to seek medical care. Do not use public transport.  Do not travel while you are sick.  Wash your hands often with soap and water for 20 seconds. If soap and water are not available, use alcohol-based hand sanitizer.  Stay away from other members of your household. Let healthy household members care for children and pets, if possible. If you have to care for children or pets, wash your hands often and wear a mask. If possible, stay in your own room, separate from others. Use a different bathroom.  Make sure that all people in your household wash their hands well and often.  Cough or sneeze into a tissue or your sleeve or elbow. Do not cough or sneeze into your hand or into the air.  Wear a cloth face covering or face mask. Make sure your mask covers your nose and mouth. Where to find more information  Centers for Disease Control and Prevention: www.cdc.gov/coronavirus/2019-ncov/index.html  World Health Organization: www.who.int/health-topics/coronavirus Contact a health care provider if:  You live in or have traveled to an area where COVID-19 is a risk and you have symptoms of the infection.  You have had contact with someone who has COVID-19 and you have symptoms of the infection. Get help right away if:  You have trouble breathing.  You have pain or pressure in your chest.  You have confusion.  You have bluish lips and fingernails.  You have difficulty waking from sleep.  You have symptoms that get worse. These symptoms may represent a serious problem that is an emergency. Do not wait to see if the symptoms will go away. Get medical help right away. Call your local emergency services (911 in the [place] Do not drive yourself to the hospital. Let the emergency medical personnel know if you think you have COVID-19. Summary  COVID-19 is a  respiratory infection that is caused by a virus. It is also known as coronavirus disease or novel coronavirus. It can cause serious infections, such as pneumonia, acute respiratory distress syndrome, acute respiratory failure, or sepsis.  The virus that causes COVID-19 is contagious. This means that it can spread from person to person through droplets from breathing, speaking, singing, coughing, or sneezing.  You are more likely to develop a serious illness if you are 50 years of age or older, have a weak immune system, live in a nursing home, or have chronic disease.  There is no medicine to treat COVID-19. Your health care provider will talk with you about ways to treat your symptoms.    Take steps to protect yourself and others from infection. Wash your hands often and disinfect objects and surfaces that are frequently touched every day. Stay away from people who are sick and wear a mask if you are sick. This information is not intended to replace advice given to you by your health care provider. Make sure you discuss any questions you have with your health care provider. Document Revised: 12/13/2018 Document Reviewed: 03/21/2018 Elsevier Patient Education  2020 Elsevier Inc.  

## 2019-05-13 ENCOUNTER — Other Ambulatory Visit: Payer: Self-pay | Admitting: *Deleted

## 2019-05-13 ENCOUNTER — Ambulatory Visit (HOSPITAL_COMMUNITY): Payer: 59

## 2019-05-13 ENCOUNTER — Ambulatory Visit (HOSPITAL_COMMUNITY)
Admission: RE | Admit: 2019-05-13 | Discharge: 2019-05-13 | Disposition: A | Discharge status: Home / Self Care | Payer: 59 | Source: Ambulatory Visit | Attending: Pulmonary Disease | Admitting: Pulmonary Disease

## 2019-05-13 DIAGNOSIS — Z20822 Contact with and (suspected) exposure to covid-19: Secondary | ICD-10-CM | POA: Insufficient documentation

## 2019-05-13 MED ORDER — EPINEPHRINE 0.3 MG/0.3ML IJ SOAJ: 0.3000 mg | Freq: Once | INTRAMUSCULAR | Status: DC | PRN

## 2019-05-13 MED ORDER — ALBUTEROL SULFATE HFA 108 (90 BASE) MCG/ACT IN AERS: 2.0000 | INHALATION_SPRAY | Freq: Once | RESPIRATORY_TRACT | Status: DC | PRN

## 2019-05-13 MED ORDER — DIPHENHYDRAMINE HCL 50 MG/ML IJ SOLN: 50.0000 mg | Freq: Once | INTRAMUSCULAR | Status: DC | PRN

## 2019-05-13 MED ORDER — FAMOTIDINE IN NACL 20-0.9 MG/50ML-% IV SOLN: 20.0000 mg | Freq: Once | INTRAVENOUS | Status: DC | PRN

## 2019-05-13 MED ORDER — SODIUM CHLORIDE 0.9 % IV SOLN
100.0000 mg | Freq: Once | INTRAVENOUS | Status: AC
  Administered 2019-05-13: 100 mg via INTRAVENOUS

## 2019-05-13 MED ORDER — METHYLPREDNISOLONE SODIUM SUCC 125 MG IJ SOLR: 125.0000 mg | Freq: Once | INTRAMUSCULAR | Status: DC | PRN

## 2019-05-13 MED ORDER — ONDANSETRON HCL 4 MG/2ML IJ SOLN
4.0000 mg | Freq: Once | INTRAMUSCULAR | Status: DC
  Filled 2019-05-13: qty 2

## 2019-05-13 MED ORDER — SODIUM CHLORIDE 0.9 % IV SOLN: INTRAVENOUS | Status: DC | PRN

## 2019-05-13 MED ORDER — SODIUM CHLORIDE 0.9 % IV SOLN
100.0000 mg | Freq: Once | INTRAVENOUS | Status: DC
  Filled 2019-05-13: qty 20

## 2019-05-13 NOTE — Progress Notes (Signed)
.   Diagnosis: COVID-19  Physician:Dr Delford Field   Procedure: Covid Infusion Clinic Med: remdesivir infusion.  Complications: No immediate complications noted.  Discharge: Discharged home   George Richards 05/13/2019

## 2019-05-13 NOTE — Patient Outreach (Signed)
[facility]) Care Management  05/13/2019  George Richards 12-Jan-1981 [id number]    Transition of care telephone call  Referral received:05/12/19 Initial outreach:05/13/19 Insurance: [facility] [employer]  Initial unsuccessful telephone call to patient's preferred number in order to complete transition of care assessment; no answer, unable to leave a message voicemail box is full.   Objective: Per electronic record George Richards  was hospitalized at [facility] from 3/13-3-14/21 for Pneumonia due to Covid 19.  Comorbidities include: Hypertension, Lumbar radiculopathy He  was discharged to home on 05/11/19 without the need for home health services or DME. Noted plan  to continue outpatient Remdesivir infusions on 3/15, 3/16 and 3/17.   Plan: This RNCM will route unsuccessful outreach letter with [facility] Care Management pamphlet and 24 hour Nurse Advice Line Magnet to [employer] Care Management clinical pool to be mailed to patient's home address. This RNCM will attempt another outreach within 4 business days.  George Garibaldi, RN, BSN  [facility] Care Management,Care Management Coordinator  [phone]- Mobile [phone]- Toll Free Main Office

## 2019-05-14 ENCOUNTER — Ambulatory Visit (HOSPITAL_COMMUNITY): Payer: 59

## 2019-05-14 ENCOUNTER — Ambulatory Visit (HOSPITAL_COMMUNITY)
Admission: RE | Admit: 2019-05-14 | Discharge: 2019-05-14 | Disposition: A | Discharge status: Home / Self Care | Payer: 59 | Source: Ambulatory Visit | Attending: Pulmonary Disease | Admitting: Pulmonary Disease

## 2019-05-14 ENCOUNTER — Encounter (HOSPITAL_COMMUNITY): Payer: Self-pay

## 2019-05-14 DIAGNOSIS — Z20822 Contact with and (suspected) exposure to covid-19: Secondary | ICD-10-CM | POA: Diagnosis not present

## 2019-05-14 MED ORDER — ONDANSETRON HCL 4 MG/2ML IJ SOLN
4.0000 mg | Freq: Once | INTRAMUSCULAR | Status: AC
  Administered 2019-05-14: 4 mg via INTRAVENOUS
  Filled 2019-05-14: qty 2

## 2019-05-14 MED ORDER — ALBUTEROL SULFATE HFA 108 (90 BASE) MCG/ACT IN AERS: 2.0000 | INHALATION_SPRAY | Freq: Once | RESPIRATORY_TRACT | Status: DC | PRN

## 2019-05-14 MED ORDER — FAMOTIDINE IN NACL 20-0.9 MG/50ML-% IV SOLN: 20.0000 mg | Freq: Once | INTRAVENOUS | Status: DC | PRN

## 2019-05-14 MED ORDER — SODIUM CHLORIDE 0.9 % IV SOLN
100.0000 mg | Freq: Once | INTRAVENOUS | Status: AC
  Administered 2019-05-14: 100 mg via INTRAVENOUS
  Filled 2019-05-14: qty 20

## 2019-05-14 MED ORDER — METHYLPREDNISOLONE SODIUM SUCC 125 MG IJ SOLR: 125.0000 mg | Freq: Once | INTRAMUSCULAR | Status: DC | PRN

## 2019-05-14 MED ORDER — DIPHENHYDRAMINE HCL 50 MG/ML IJ SOLN: 50.0000 mg | Freq: Once | INTRAMUSCULAR | Status: DC | PRN

## 2019-05-14 MED ORDER — SODIUM CHLORIDE 0.9 % IV SOLN: INTRAVENOUS | Status: DC | PRN

## 2019-05-14 MED ORDER — EPINEPHRINE 0.3 MG/0.3ML IJ SOAJ: 0.3000 mg | Freq: Once | INTRAMUSCULAR | Status: DC | PRN

## 2019-05-14 NOTE — Discharge Instructions (Signed)

## 2019-05-14 NOTE — Progress Notes (Signed)
  Diagnosis: COVID-19  Physician: Dr. Wright  Procedure: Covid Infusion Clinic Med: remdesivir infusion.  Complications: No immediate complications noted.  Discharge: Discharged home   George Richards S Alder Murri 05/14/2019  

## 2019-05-15 LAB — CULTURE, BLOOD (ROUTINE X 2)
Culture: NO GROWTH
Culture: NO GROWTH
Special Requests: ADEQUATE

## 2019-05-16 ENCOUNTER — Other Ambulatory Visit: Payer: Self-pay | Admitting: *Deleted

## 2019-05-16 ENCOUNTER — Encounter: Payer: Self-pay | Admitting: *Deleted

## 2019-05-16 NOTE — Patient Outreach (Addendum)
[facility]) Care Management  05/16/2019  George Richards [birth year] [id number]   Transition of care call/case closure   Referral received: 04/14/19 Initial outreach:05/13/19 Insurance: [place] [employer]   Subjective: 2nd attempt successful telephone call to patient's preferred number in order to complete transition of care assessment; 2 HIPAA identifiers verified. Explained purpose of call and completed transition of care assessment.  States he is doing a whole lot better . States he completed the 3 outpatient remdisivir treatments. He denies shortness of breath or  productive cough denies need for albuterol inhaler. He states his oxygen saturation reading has been 94-96 %. He denies having temperature elevation .  He reports that he appetite and intake is improving after getting his taste back. He continues to focus on staying hydrated drinking plenty fluids.  tolerating diet.  He reports being independent in care his mother is available to assist as needed.   He discussed  any ongoing health issue of hypertension discussed  [place] chronic disease management programs he declines referral at this time. States that he continues to monitor blood pressure at home and it is still running a little higher, unable to provide readings at this time review normal blood pressure ranges.  He doesnot have the hospital indemnity He uses a [employer] outpatient pharmacy at [facility] employee pharmacy.  Discussed  staying safe during the COVID 19 pandemic post discharge.    Objective:  George Richards was hospitalized at [facility] from 3/13-3-14/21 for Pneumonia due to Covid 19.  Comorbidities include: Hypertension, Lumbar radiculopathy He  was discharged to home on 05/11/19 without the need for home health servicesor DME. Noted plan  to continue outpatient Remdesivir infusions on 3/15, 3/16 and 3/17.     Assessment:  Patient voices good understanding of all discharge  instructions.  See transition of care flowsheet for assessment details.   Plan:  Reviewed hospital discharge diagnosis of Pneumonia due to Covid 19    and discharge treatment plan using hospital discharge instuctions, assessing medication adherence, reviewing problems requiring provider notification, and discussing the importance of follow up with  primary care provider  as directed. Reviewed [place] healthy lifestyle program information to receive discounted premium for  2022  Step 1: Get annual physical between February 27, 2018 and August 28, 2019; Step 2: Complete your health assessment between February 28, 2019 and October 29, 2019 at [email or web address] Step 3:Identify your current health status and complete the corresponding action step between January 1, and October 29, 2019.   Will send patient information on  [employer] resource on condition management of chronic conditions to his personal email as requested securely. Will send EMMI handout on managing hypertension with successful outreach letter.  Patient agreeable to return call next week  to follow up scheduling post discharge visit with Dr. Letitia Libra, PCP.     [name], RN, BSN  [facility] Care Management,Care Management Coordinator  [phone]- Mobile [phone]- Toll Free Main Office

## 2019-05-21 ENCOUNTER — Other Ambulatory Visit: Payer: Self-pay | Admitting: *Deleted

## 2019-05-21 NOTE — Patient Outreach (Signed)
[facility]) Care Management  05/21/2019  BRANSTON HALSTED 01/27/1981 [id number]   Transition of care Follow up  telephone call  Referral received:05/12/19 Initial outreach:05/13/19 Insurance: [place] [employer]   Subjective: Unsuccessful outreach call to patient as agreed to follow up on scheduling post discharge visit with Primary care provider, No answer unable to leave a message as mailbox is full.   Objective:  [place] was hospitalized at [facility] from 3/13-3-14/21 for Pneumonia due to Covid 19.  Comorbidities include: Hypertension, Lumbar radiculopathy He  was discharged to home on 05/11/19 without the need for home health servicesor DME. Patient has completed  outpatient Remdesivir infusions on 3/15, 3/16 and 3/17.   Plan: Will plan return call in the next 4 business days to follow up on patient scheduling post discharge visit.    [name], RN, BSN  [facility] Care Management,Care Management Coordinator  [phone]- Mobile [phone]- Toll Free Main Office

## 2019-05-26 ENCOUNTER — Other Ambulatory Visit: Payer: Self-pay | Admitting: *Deleted

## 2019-05-26 NOTE — Patient Outreach (Signed)
[facility]) Care Management  05/26/2019  JUANLUIS GUASTELLA 1980-09-14 [id number]   Follow up Transition of care call  Call attempt #2  Referral received:05/12/19 Initial outreach:05/13/19 Initial successful outreach: 05/16/19 Insurance: Focus Plan  Subjective:  Unsuccessful outreach call to patient as agreed to follow up on scheduling post discharge visit with Primary care provider, No answer unable to leave a message as mailbox is full.  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  Plan: Will plan 3rd call attempt for follow up on scheduling post discharge visit with PCP. Will schedule return call in the next 4 business days.    [name], RN, BSN  [facility] Care Management,Care Management Coordinator  [phone]- Mobile [phone]- Toll Free Main Office

## 2019-05-29 ENCOUNTER — Other Ambulatory Visit: Payer: Self-pay | Admitting: *Deleted

## 2019-05-29 NOTE — Patient Outreach (Signed)
[facility]) Care Management  05/29/2019  George Richards 07-12-1980 [id number]   Transition of care/follow up call     Follow up Transition of care call/Case Closure   Call attempt #3  Referral received:05/12/19 Initial outreach:05/13/19 Initial successful outreach: 05/16/19 Insurance: Focus Plan   Subjective:  Unsuccessful outreach call to patient as agreed to follow up on scheduling post discharge visit with Primary care provider, No answer unable to leave a message as mailbox is full. 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   Plan Will plan case closure, unsuccessful call attempt follow up x 3 post 10 days after initial successful call for follow up on scheduling post discharge PCP appointment    [name], RN, BSN  [facility] Care Management,Care Management Coordinator  [phone]- Mobile [phone]- Toll Free Main Office

## 2020-02-19 IMAGING — CT CT RENAL STONE PROTOCOL
2 of 4 series · 17 of 46 positions shown, 19 images · non-contrast
Comparison: 09/05/2010

CLINICAL DATA: Left flank pain and hematuria for several days.
Nephrolithiasis.

EXAM:
CT ABDOMEN AND PELVIS WITHOUT CONTRAST
TECHNIQUE: Multidetector CT imaging of the abdomen and pelvis was performed
following the standard protocol without IV contrast.

[Series 2: stone full standard · axial · 0.82mm/px · z∈[-1124,-610]mm · 14 of 113 slices shown, 16 images]
[im 5/113  soft-tissue]
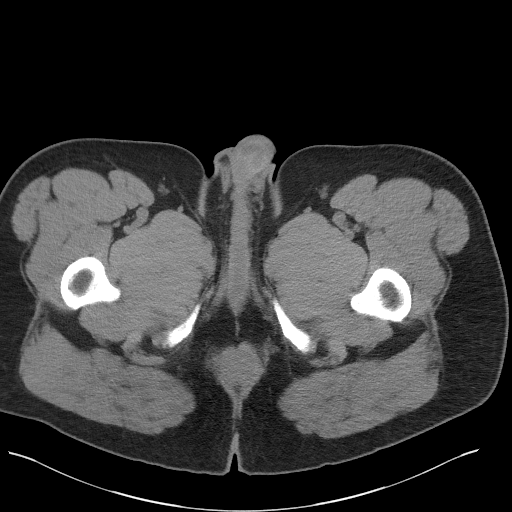
[im 5/113  bone]
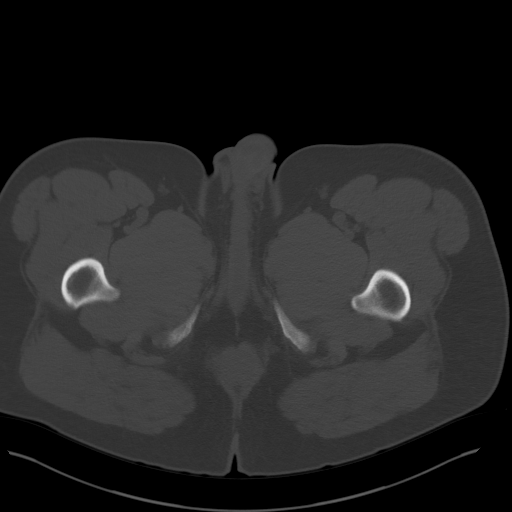
[im 15/113  soft-tissue]
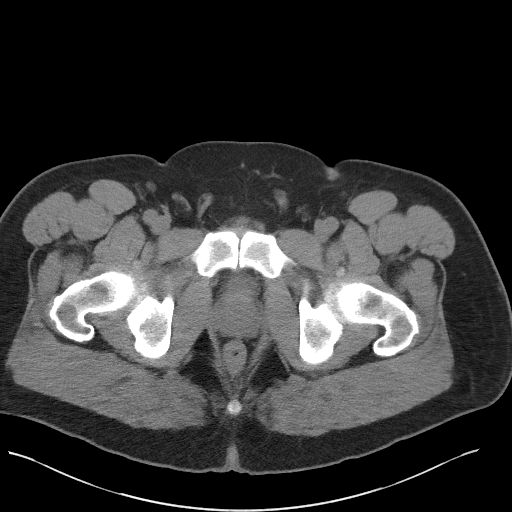
[im 24/113  soft-tissue]
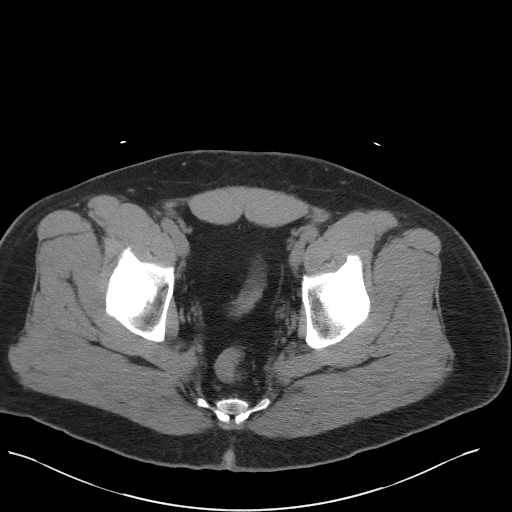
[im 29/113  soft-tissue]
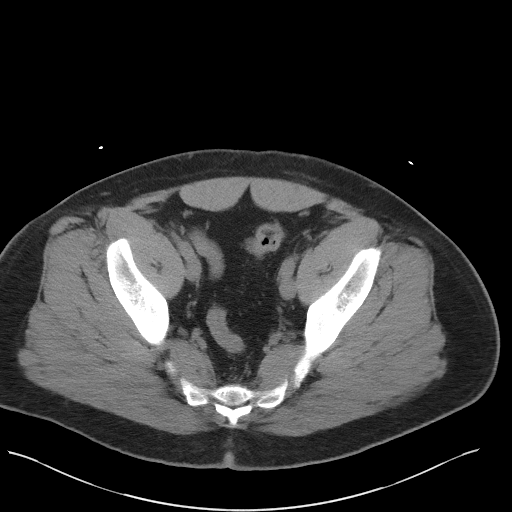
[im 38/113  soft-tissue]
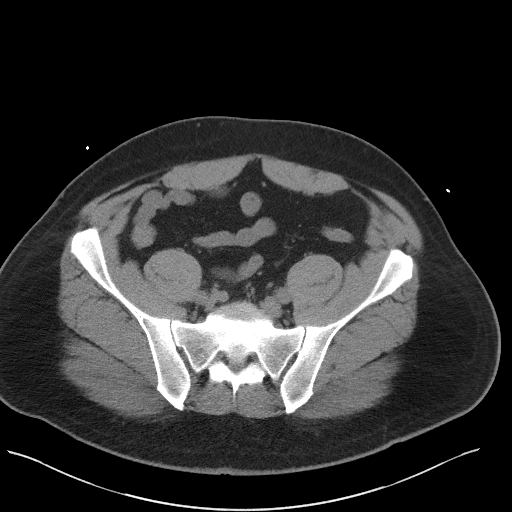
[im 47/113  soft-tissue]
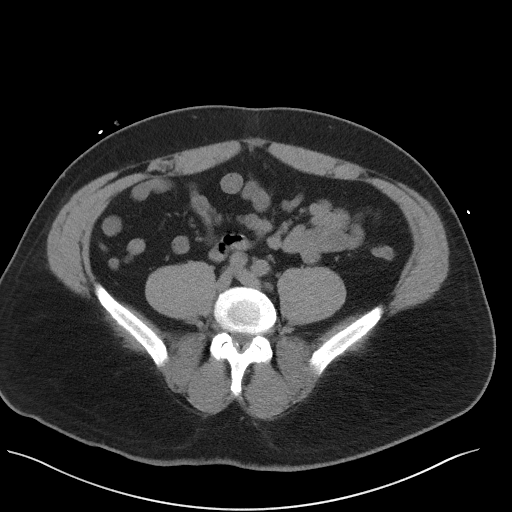
[im 52/113  soft-tissue]
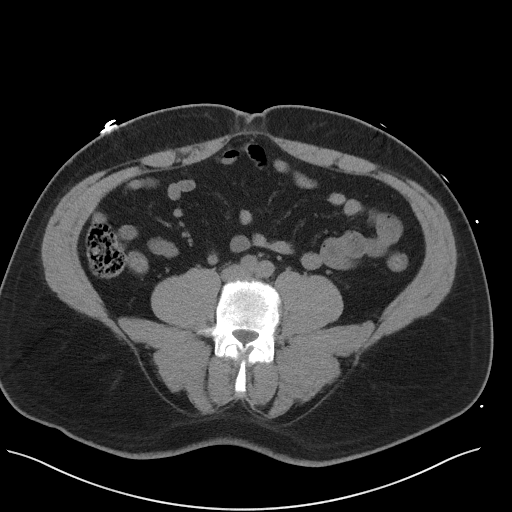
[im 61/113  soft-tissue]
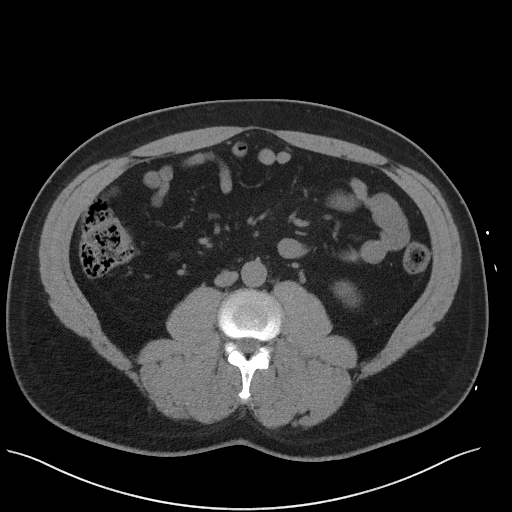
[im 66/113  soft-tissue]
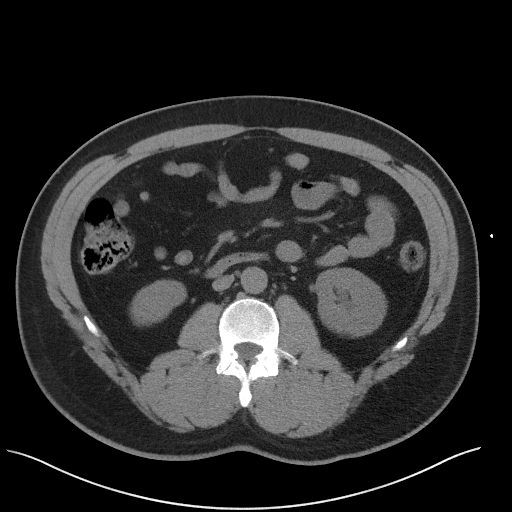
[im 66/113  bone]
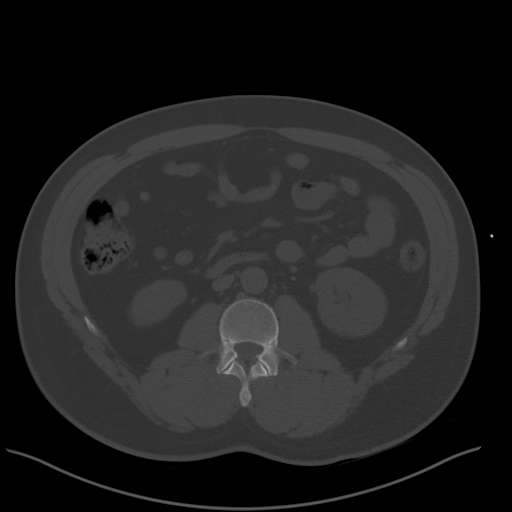
[im 75/113  soft-tissue]
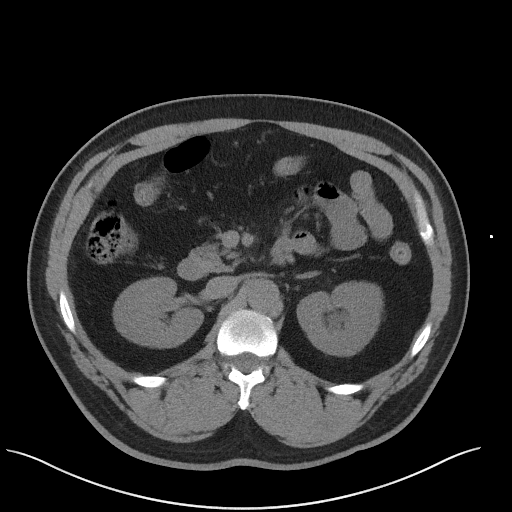
[im 85/113  soft-tissue]
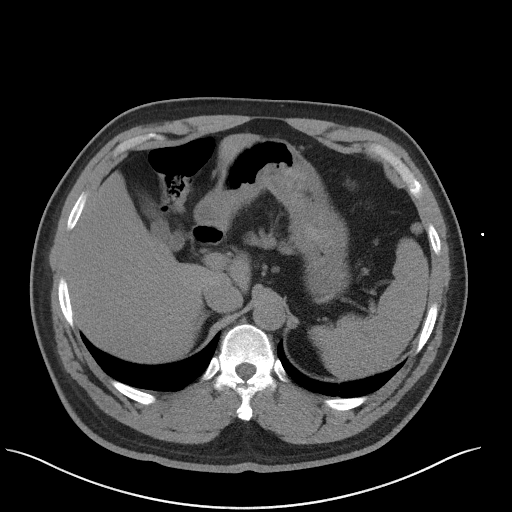
[im 89/113  soft-tissue]
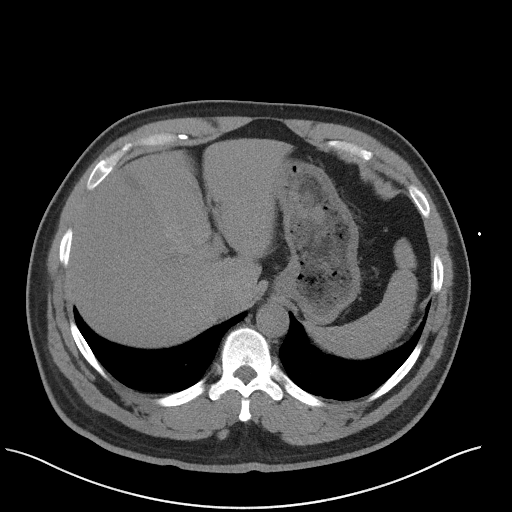
[im 99/113  soft-tissue]
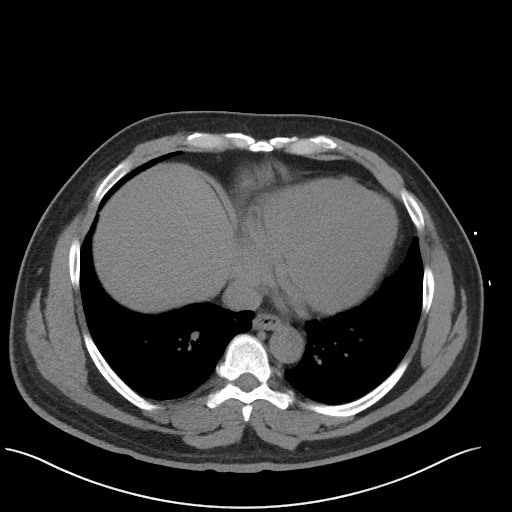
[im 108/113  soft-tissue]
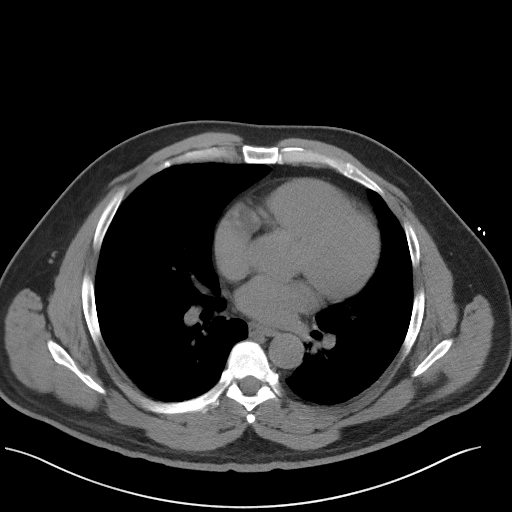

[Series 5: coronal · coronal · 0.90mm/px · 3 of 143 slices shown]
[im 48/143  soft-tissue]
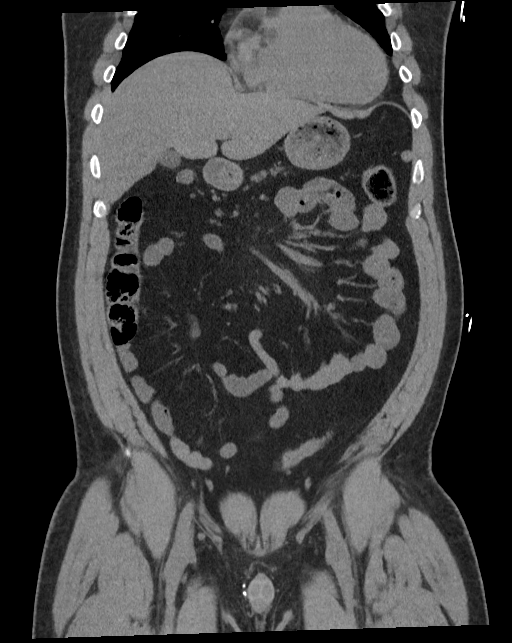
[im 64/143  soft-tissue]
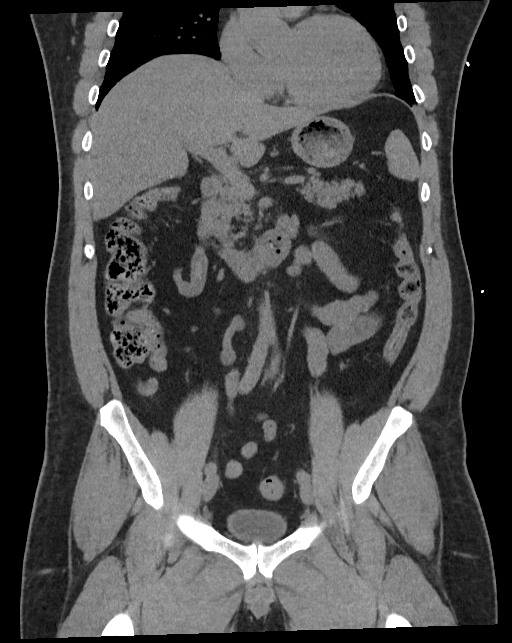
[im 79/143  soft-tissue]
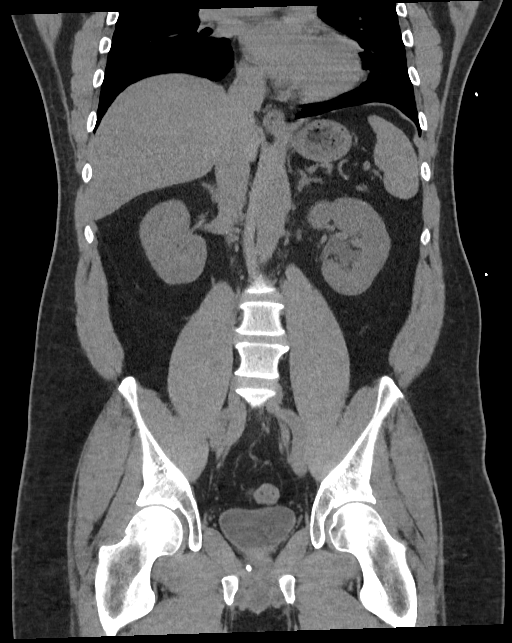

[17 of 46 positions shown; findings below may reference images not displayed]

FINDINGS: Lower chest: No acute findings.

Hepatobiliary: No mass visualized on this unenhanced exam. Decreased
hepatic steatosis noted compared to previous exam. Gallbladder is
unremarkable.

Pancreas: No mass or inflammatory process visualized on this
unenhanced exam.

Spleen:  Within normal limits in size.

Adrenals/Urinary tract: Mild left hydronephrosis is seen due to 2 mm
calculus in the proximal left ureter.

Stomach/Bowel: No evidence of obstruction, inflammatory process, or
abnormal fluid collections.

Vascular/Lymphatic: No pathologically enlarged lymph nodes
identified. No evidence of abdominal aortic aneurysm.

Reproductive:  No mass or other significant abnormality.

Other:  None.

Musculoskeletal:  No suspicious bone lesions identified.
IMPRESSION: Mild left hydronephrosis due to 2 mm proximal left ureteral
calculus.

## 2021-01-03 IMAGING — DX DG CHEST 1V PORT
1 series · 1 of 1 positions shown · non-contrast
Comparison: None.

CLINICAL DATA: Shortness of breath

EXAM:
PORTABLE CHEST 1 VIEW

[chest ap]
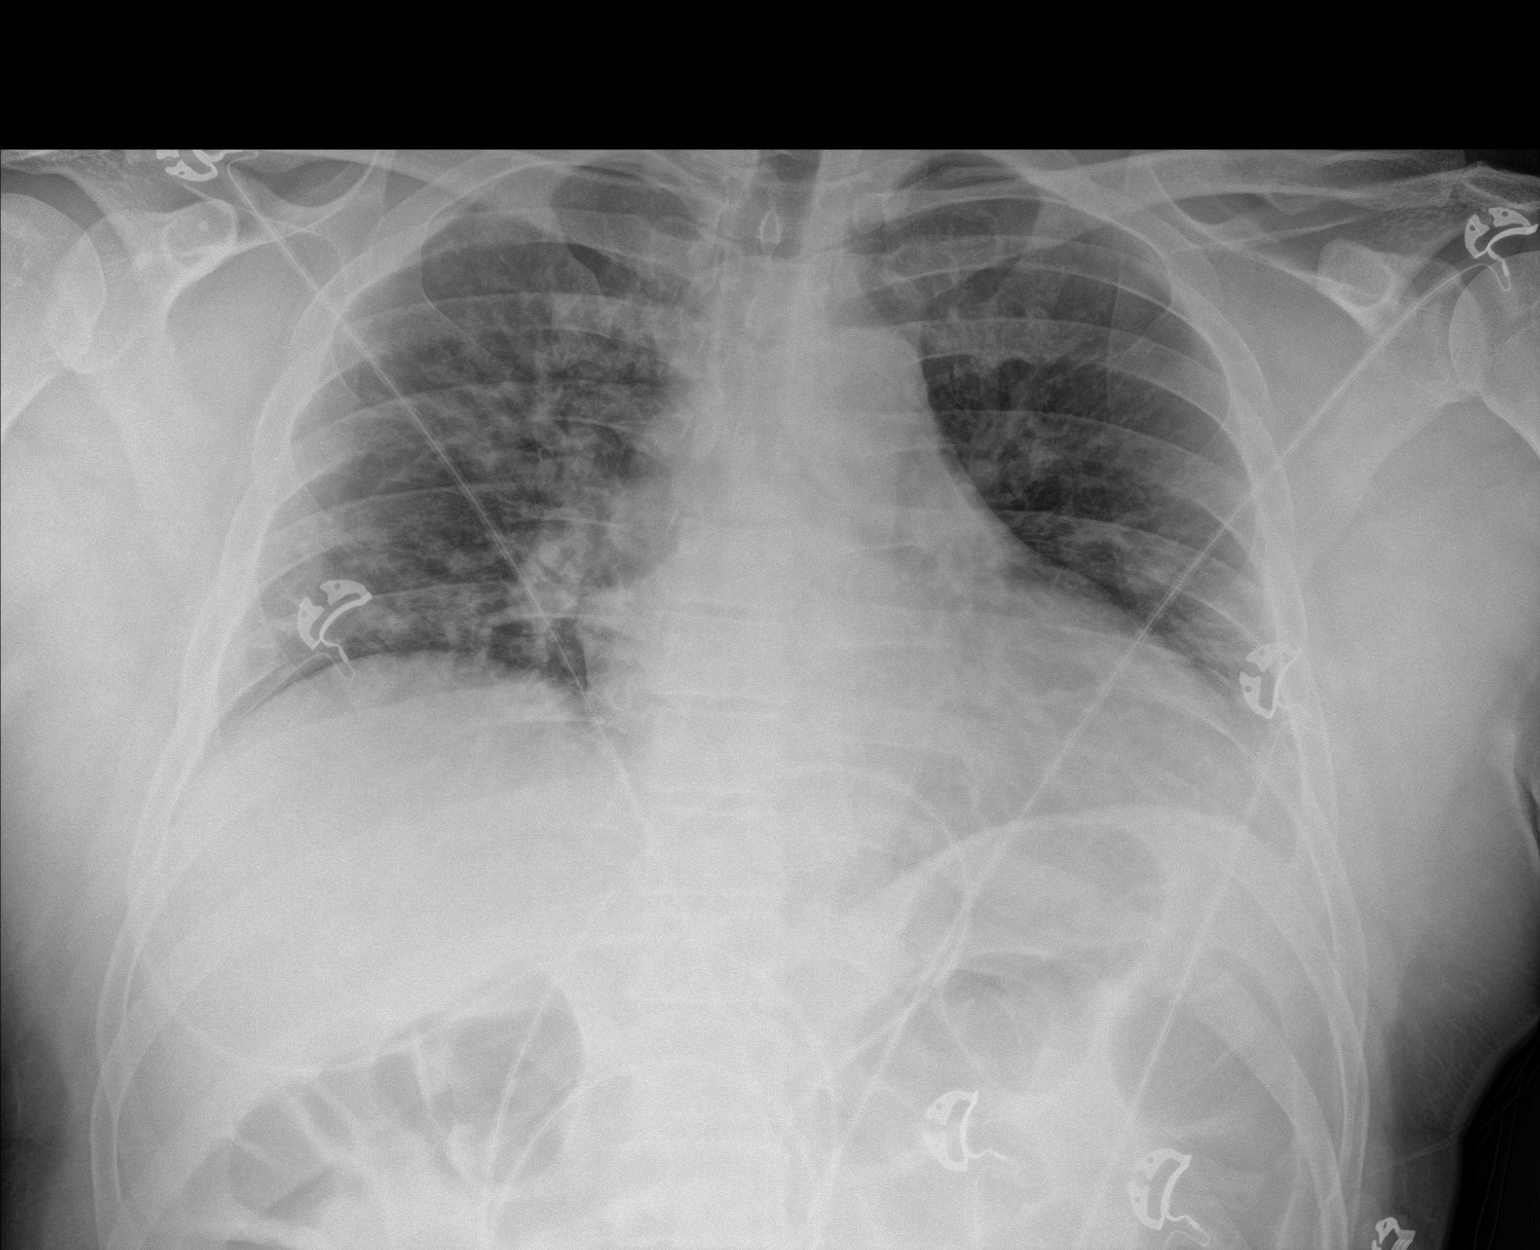

[1 of 1 positions shown; findings below may reference images not displayed]

FINDINGS: Low lung volumes.

Mild multifocal patchy opacities in the right upper lobe, lingula,
and right lower lobe. This appearance is suspicious for multifocal
pneumonia. No pleural effusion or pneumothorax.

Heart is normal in size.
IMPRESSION: Multifocal pneumonia.

## 2023-02-11 ENCOUNTER — Emergency Department: Payer: Managed Care, Other (non HMO)

## 2023-02-11 ENCOUNTER — Inpatient Hospital Stay
Admission: EM | Admit: 2023-02-11 | Discharge: 2023-02-15 | DRG: 286 | Disposition: A | Discharge status: Home / Self Care | Payer: Managed Care, Other (non HMO) | Attending: Internal Medicine | Admitting: Internal Medicine

## 2023-02-11 ENCOUNTER — Other Ambulatory Visit: Payer: Self-pay

## 2023-02-11 DIAGNOSIS — I5043 Acute on chronic combined systolic (congestive) and diastolic (congestive) heart failure: Secondary | ICD-10-CM | POA: Diagnosis not present

## 2023-02-11 DIAGNOSIS — I2489 Other forms of acute ischemic heart disease: Secondary | ICD-10-CM | POA: Diagnosis present

## 2023-02-11 DIAGNOSIS — E876 Hypokalemia: Secondary | ICD-10-CM | POA: Diagnosis present

## 2023-02-11 DIAGNOSIS — T461X6A Underdosing of calcium-channel blockers, initial encounter: Secondary | ICD-10-CM | POA: Diagnosis present

## 2023-02-11 DIAGNOSIS — I110 Hypertensive heart disease with heart failure: Principal | ICD-10-CM | POA: Diagnosis present

## 2023-02-11 DIAGNOSIS — Z825 Family history of asthma and other chronic lower respiratory diseases: Secondary | ICD-10-CM | POA: Diagnosis not present

## 2023-02-11 DIAGNOSIS — Z79899 Other long term (current) drug therapy: Secondary | ICD-10-CM

## 2023-02-11 DIAGNOSIS — N179 Acute kidney failure, unspecified: Secondary | ICD-10-CM | POA: Diagnosis present

## 2023-02-11 DIAGNOSIS — Z833 Family history of diabetes mellitus: Secondary | ICD-10-CM

## 2023-02-11 DIAGNOSIS — R0609 Other forms of dyspnea: Secondary | ICD-10-CM | POA: Diagnosis not present

## 2023-02-11 DIAGNOSIS — Z8616 Personal history of COVID-19: Secondary | ICD-10-CM

## 2023-02-11 DIAGNOSIS — Z91128 Patient's intentional underdosing of medication regimen for other reason: Secondary | ICD-10-CM

## 2023-02-11 DIAGNOSIS — F17220 Nicotine dependence, chewing tobacco, uncomplicated: Secondary | ICD-10-CM | POA: Diagnosis present

## 2023-02-11 DIAGNOSIS — Z6835 Body mass index (BMI) 35.0-35.9, adult: Secondary | ICD-10-CM | POA: Diagnosis not present

## 2023-02-11 DIAGNOSIS — I5021 Acute systolic (congestive) heart failure: Secondary | ICD-10-CM | POA: Diagnosis not present

## 2023-02-11 DIAGNOSIS — I10 Essential (primary) hypertension: Secondary | ICD-10-CM | POA: Diagnosis not present

## 2023-02-11 DIAGNOSIS — G4733 Obstructive sleep apnea (adult) (pediatric): Secondary | ICD-10-CM | POA: Diagnosis present

## 2023-02-11 DIAGNOSIS — F419 Anxiety disorder, unspecified: Secondary | ICD-10-CM | POA: Diagnosis present

## 2023-02-11 DIAGNOSIS — E861 Hypovolemia: Secondary | ICD-10-CM | POA: Diagnosis not present

## 2023-02-11 DIAGNOSIS — I429 Cardiomyopathy, unspecified: Secondary | ICD-10-CM | POA: Diagnosis present

## 2023-02-11 DIAGNOSIS — R7989 Other specified abnormal findings of blood chemistry: Secondary | ICD-10-CM | POA: Diagnosis present

## 2023-02-11 DIAGNOSIS — Y92009 Unspecified place in unspecified non-institutional (private) residence as the place of occurrence of the external cause: Secondary | ICD-10-CM

## 2023-02-11 DIAGNOSIS — E6689 Other obesity not elsewhere classified: Secondary | ICD-10-CM | POA: Diagnosis not present

## 2023-02-11 DIAGNOSIS — I959 Hypotension, unspecified: Secondary | ICD-10-CM | POA: Diagnosis not present

## 2023-02-11 DIAGNOSIS — R000 Tachycardia, unspecified: Secondary | ICD-10-CM | POA: Diagnosis present

## 2023-02-11 DIAGNOSIS — Z7952 Long term (current) use of systemic steroids: Secondary | ICD-10-CM | POA: Diagnosis not present

## 2023-02-11 DIAGNOSIS — Z823 Family history of stroke: Secondary | ICD-10-CM | POA: Diagnosis not present

## 2023-02-11 DIAGNOSIS — I509 Heart failure, unspecified: Principal | ICD-10-CM

## 2023-02-11 DIAGNOSIS — Z8249 Family history of ischemic heart disease and other diseases of the circulatory system: Secondary | ICD-10-CM | POA: Diagnosis not present

## 2023-02-11 DIAGNOSIS — Z87442 Personal history of urinary calculi: Secondary | ICD-10-CM

## 2023-02-11 DIAGNOSIS — I5041 Acute combined systolic (congestive) and diastolic (congestive) heart failure: Secondary | ICD-10-CM | POA: Diagnosis present

## 2023-02-11 LAB — CBC
HCT: 43.5 % (ref 39.0–52.0)
Hemoglobin: 14.3 g/dL (ref 13.0–17.0)
MCH: 27.3 pg (ref 26.0–34.0)
MCHC: 32.9 g/dL (ref 30.0–36.0)
MCV: 83.2 fL (ref 80.0–100.0)
Platelets: 255 10*3/uL (ref 150–400)
RBC: 5.23 MIL/uL (ref 4.22–5.81)
RDW: 14 % (ref 11.5–15.5)
WBC: 8.1 10*3/uL (ref 4.0–10.5)
nRBC: 0 % (ref 0.0–0.2)

## 2023-02-11 LAB — HEPATIC FUNCTION PANEL
ALT: 31 U/L (ref 0–44)
AST: 27 U/L (ref 15–41)
Albumin: 4.2 g/dL (ref 3.5–5.0)
Alkaline Phosphatase: 53 U/L (ref 38–126)
Bilirubin, Direct: 0.2 mg/dL (ref 0.0–0.2)
Indirect Bilirubin: 0.8 mg/dL (ref 0.3–0.9)
Total Bilirubin: 1 mg/dL (ref <1.2)
Total Protein: 7.1 g/dL (ref 6.5–8.1)

## 2023-02-11 LAB — BASIC METABOLIC PANEL
Anion gap: 7 (ref 5–15)
BUN: 21 mg/dL — ABNORMAL HIGH (ref 6–20)
CO2: 25 mmol/L (ref 22–32)
Calcium: 9 mg/dL (ref 8.9–10.3)
Chloride: 106 mmol/L (ref 98–111)
Creatinine, Ser: 1.1 mg/dL (ref 0.61–1.24)
GFR, Estimated: >60 mL/min (ref >60)
Glucose, Bld: 111 mg/dL — ABNORMAL HIGH (ref 70–99)
Potassium: 3.9 mmol/L (ref 3.5–5.1)
Sodium: 138 mmol/L (ref 135–145)

## 2023-02-11 LAB — LIPASE, BLOOD: Lipase: 30 U/L (ref 11–51)

## 2023-02-11 LAB — BRAIN NATRIURETIC PEPTIDE: B Natriuretic Peptide: 877.8 pg/mL — ABNORMAL HIGH (ref 0.0–100.0)

## 2023-02-11 LAB — TROPONIN I (HIGH SENSITIVITY)
Troponin I (High Sensitivity): 28 ng/L — ABNORMAL HIGH (ref <18)
Troponin I (High Sensitivity): 30 ng/L — ABNORMAL HIGH (ref <18)

## 2023-02-11 MED ORDER — ONDANSETRON HCL 4 MG PO TABS: 4.0000 mg | ORAL_TABLET | Freq: Four times a day (QID) | ORAL | Status: DC | PRN

## 2023-02-11 MED ORDER — ACETAMINOPHEN 650 MG RE SUPP: 650.0000 mg | Freq: Four times a day (QID) | RECTAL | Status: DC | PRN

## 2023-02-11 MED ORDER — ACETAMINOPHEN 325 MG PO TABS
650.0000 mg | ORAL_TABLET | Freq: Four times a day (QID) | ORAL | Status: DC | PRN
  Administered 2023-02-11: 650 mg via ORAL
  Filled 2023-02-11: qty 2

## 2023-02-11 MED ORDER — NITROGLYCERIN 0.4 MG SL SUBL: 0.4000 mg | SUBLINGUAL_TABLET | SUBLINGUAL | Status: DC | PRN

## 2023-02-11 MED ORDER — FUROSEMIDE 10 MG/ML IJ SOLN
20.0000 mg | Freq: Two times a day (BID) | INTRAMUSCULAR | Status: AC
  Administered 2023-02-11 – 2023-02-12 (×2): 20 mg via INTRAVENOUS
  Filled 2023-02-11 (×2): qty 4

## 2023-02-11 MED ORDER — CLONAZEPAM 1 MG PO TABS
1.0000 mg | ORAL_TABLET | Freq: Two times a day (BID) | ORAL | Status: DC | PRN
  Administered 2023-02-12 – 2023-02-13 (×3): 1 mg via ORAL
  Filled 2023-02-11 (×3): qty 2
  Filled 2023-02-11: qty 1

## 2023-02-11 MED ORDER — CLONAZEPAM 0.5 MG PO TABS
0.5000 mg | ORAL_TABLET | Freq: Once | ORAL | Status: AC
  Administered 2023-02-11: 0.5 mg via ORAL
  Filled 2023-02-11: qty 1

## 2023-02-11 MED ORDER — IOHEXOL 350 MG/ML SOLN
75.0000 mL | Freq: Once | INTRAVENOUS | Status: AC | PRN
  Administered 2023-02-11: 75 mL via INTRAVENOUS

## 2023-02-11 MED ORDER — FUROSEMIDE 10 MG/ML IJ SOLN
60.0000 mg | Freq: Once | INTRAMUSCULAR | Status: AC
  Administered 2023-02-11: 60 mg via INTRAVENOUS
  Filled 2023-02-11: qty 8

## 2023-02-11 MED ORDER — HYDRALAZINE HCL 20 MG/ML IJ SOLN
5.0000 mg | Freq: Four times a day (QID) | INTRAMUSCULAR | Status: DC | PRN
  Filled 2023-02-11: qty 1

## 2023-02-11 MED ORDER — ONDANSETRON HCL 4 MG/2ML IJ SOLN
4.0000 mg | Freq: Four times a day (QID) | INTRAMUSCULAR | Status: DC | PRN
  Administered 2023-02-14: 4 mg via INTRAVENOUS
  Filled 2023-02-11: qty 2

## 2023-02-11 MED ORDER — LABETALOL HCL 5 MG/ML IV SOLN: 5.0000 mg | Freq: Once | INTRAVENOUS | Status: DC

## 2023-02-11 MED ORDER — ALBUTEROL SULFATE (2.5 MG/3ML) 0.083% IN NEBU: 2.5000 mg | INHALATION_SOLUTION | RESPIRATORY_TRACT | Status: DC | PRN

## 2023-02-11 MED ORDER — SENNOSIDES-DOCUSATE SODIUM 8.6-50 MG PO TABS: 1.0000 | ORAL_TABLET | Freq: Every evening | ORAL | Status: DC | PRN

## 2023-02-11 MED ORDER — LORAZEPAM 2 MG/ML IJ SOLN
0.5000 mg | Freq: Four times a day (QID) | INTRAMUSCULAR | Status: AC | PRN
Start: 2023-02-11 — End: 2023-02-12
  Administered 2023-02-11 – 2023-02-12 (×2): 0.5 mg via INTRAVENOUS
  Filled 2023-02-11 (×2): qty 1

## 2023-02-11 MED ORDER — NITROGLYCERIN 2 % TD OINT
0.5000 [in_us] | TOPICAL_OINTMENT | Freq: Once | TRANSDERMAL | Status: AC
  Administered 2023-02-11: 0.5 [in_us] via TOPICAL
  Filled 2023-02-11: qty 1

## 2023-02-11 MED ORDER — HEPARIN SODIUM (PORCINE) 5000 UNIT/ML IJ SOLN
5000.0000 [IU] | Freq: Three times a day (TID) | INTRAMUSCULAR | Status: DC
  Administered 2023-02-11 – 2023-02-15 (×10): 5000 [IU] via SUBCUTANEOUS
  Filled 2023-02-11 (×11): qty 1

## 2023-02-11 MED ORDER — AMLODIPINE BESYLATE 5 MG PO TABS
5.0000 mg | ORAL_TABLET | Freq: Every day | ORAL | Status: DC
  Administered 2023-02-12 – 2023-02-14 (×3): 5 mg via ORAL
  Filled 2023-02-11 (×3): qty 1

## 2023-02-11 NOTE — Assessment & Plan Note (Addendum)
Ativan 0.5 mg IV every 6 hours as needed for anxiety, seizure, 2 doses ordered Home clonazepam 1 mg p.o. twice daily as needed for anxiety resumed for 02/12/2023 at 7 AM

## 2023-02-11 NOTE — ED Notes (Signed)
Pt eating steak, salad and baked potato. Pt c/o slight HA. Will medicate with tylenol

## 2023-02-11 NOTE — ED Notes (Signed)
Patient is pacing in the room, c/o heat. Temperature was lowered in the room by the patient. Patient is unable to tolerate monitoring equipment at this time. Patient was given an electric fan to use for comfort.

## 2023-02-11 NOTE — Assessment & Plan Note (Addendum)
Strict I's and O's Complete echo ordered Status post furosemide 60 mg IV one-time dose ordered Furosemide 20 mg IV twice daily, 2 doses ordered on admission   telemetry cardiac, inpatient

## 2023-02-11 NOTE — ED Provider Notes (Signed)
[facility] Provider Note    Event Date/Time   First MD Initiated Contact with Patient 02/11/23 1205     (approximate)   History   Shortness of Breath and Chest Pain   HPI  George Richards is a 42 y.o. male no significant past medical history other than history of hypertension prescribed amlodipine but noncompliant with that presents to the ER for evaluation of shortness of breath chest discomfort     Physical Exam   Triage Vital Signs: ED Triage Vitals  Encounter Vitals Group     BP 02/11/23 1034 (!) 149/127     Systolic BP Percentile --      Diastolic BP Percentile --      Pulse Rate 02/11/23 1034 (!) 105     Resp 02/11/23 1034 20     Temp 02/11/23 1034 98 F (36.7 C)     Temp src --      SpO2 02/11/23 1034 96 %     Weight 02/11/23 1032 250 lb (113.4 kg)     Height 02/11/23 1032 5\' 10"  (1.778 m)     Head Circumference --      Peak Flow --      Pain Score 02/11/23 1032 6     Pain Loc --      Pain Education --      Exclude from Growth Chart --     Most recent vital signs: Vitals:   02/11/23 1232 02/11/23 1336  BP: (!) 131/108 (!) 132/93  Pulse: (!) 102 97  Resp: 20 18  Temp:    SpO2: 96% 96%     Constitutional: Alert  Eyes: Conjunctivae are normal.  Head: Atraumatic. Nose: No congestion/rhinnorhea. Mouth/Throat: Mucous membranes are moist.   Neck: Painless ROM.  Cardiovascular:   Good peripheral circulation. Respiratory: Normal respiratory effort.  No retractions.  Gastrointestinal: Soft and nontender.  Musculoskeletal:  no deformity. 2+ BLE  Neurologic:  MAE spontaneously. No gross focal neurologic deficits are appreciated.  Skin:  Skin is warm, dry and intact. No rash noted. Psychiatric: Mood and affect are normal. Speech and behavior are normal.    ED Results / Procedures / Treatments   Labs (all labs ordered are listed, but only abnormal results are displayed) Labs Reviewed  BASIC METABOLIC PANEL - Abnormal; Notable  for the following components:      Result Value   Glucose, Bld 111 (*)    BUN 21 (*)    All other components within normal limits  BRAIN NATRIURETIC PEPTIDE - Abnormal; Notable for the following components:   B Natriuretic Peptide 877.8 (*)    All other components within normal limits  TROPONIN I (HIGH SENSITIVITY) - Abnormal; Notable for the following components:   Troponin I (High Sensitivity) 28 (*)    All other components within normal limits  CBC  HEPATIC FUNCTION PANEL  LIPASE, BLOOD  HIV ANTIBODY (ROUTINE TESTING W REFLEX)  TROPONIN I (HIGH SENSITIVITY)     EKG  ED ECG REPORT I, Willy Eddy, the attending physician, personally viewed and interpreted this ECG.   Date: 02/11/2023  EKG Time: 10:37  Rate: 105  Rhythm: sinus  Axis: normal  Intervals: normal  ST&T Change: nonspecific st abn    RADIOLOGY Please see ED Course for my review and interpretation.  I personally reviewed all radiographic images ordered to evaluate for the above acute complaints and reviewed radiology reports and findings.  These findings were personally discussed with the patient.  Please see medical record for radiology report.    PROCEDURES:  Critical Care performed: No  Procedures   MEDICATIONS ORDERED IN ED: Medications  acetaminophen (TYLENOL) tablet 650 mg (has no administration in time range)    Or  acetaminophen (TYLENOL) suppository 650 mg (has no administration in time range)  ondansetron (ZOFRAN) tablet 4 mg (has no administration in time range)    Or  ondansetron (ZOFRAN) injection 4 mg (has no administration in time range)  heparin injection 5,000 Units (has no administration in time range)  senna-docusate (Senokot-S) tablet 1 tablet (has no administration in time range)  clonazePAM (KLONOPIN) tablet 0.5 mg (0.5 mg Oral Given 02/11/23 1253)  nitroGLYCERIN (NITROGLYN) 2 % ointment 0.5 inch (0.5 inches Topical Given 02/11/23 1255)  iohexol (OMNIPAQUE) 350 MG/ML  injection 75 mL (75 mLs Intravenous Contrast Given 02/11/23 1310)  furosemide (LASIX) injection 60 mg (60 mg Intravenous Given 02/11/23 1345)     IMPRESSION / MDM / ASSESSMENT AND PLAN / ED COURSE  I reviewed the triage vital signs and the nursing notes.                              Differential diagnosis includes, but is not limited to, Asthma, copd, CHF, pna, ptx, malignancy, Pe, anemia   Patient presenting to the ER for evaluation of symptoms as described above.  Based on symptoms, risk factors and considered above differential, this presenting complaint could reflect a potentially life-threatening illness therefore the patient will be placed on continuous pulse oximetry and telemetry for monitoring.  Laboratory evaluation will be sent to evaluate for the above complaints.      Clinical Course as of 02/11/23 1428  Sun Feb 11, 2023  1245 Patient quite anxious and requesting Klonopin which she takes at home. [PR]  1319 CTA my review and interpretation without evidence of saddle PE will await formal radiology report. [PR]  1319 BNP is significantly elevated. [PR]  1352 Patient with tachypnea on exertion and significant orthopnea findings concerning for new onset congestive heart failure.  Diastolic pressures are improving with Nitropaste.  I will consult hospitalist for admission. [PR]    Clinical Course User Index [PR] Willy Eddy, MD     FINAL CLINICAL IMPRESSION(S) / ED DIAGNOSES   Final diagnoses:  Acute on chronic congestive heart failure, unspecified heart failure type [facility])     Rx / DC Orders   ED Discharge Orders     None        Note:  This document was prepared using Dragon voice recognition software and may include unintentional dictation errors.    Willy Eddy, MD 02/11/23 [phone]

## 2023-02-11 NOTE — Assessment & Plan Note (Signed)
This meets criteria for morbid obesity based on the presence of 1 or more chronic comorbidities. Patient has 35.8. This complicates overall care and prognosis.

## 2023-02-11 NOTE — H&P (Signed)
History and Physical   George Richards:[id number] DOB: [birth year] DOA: 02/11/2023  PCP: Pcp, No  Patient coming from: home   I have personally briefly reviewed patient's old medical records in [facility] EMR.  Chief Concern: shortness of breath.   HPI: Mr. George Richards is a 42 year old male with history of obesity, hypertension, who presents emergency department for chief concerns of shortness of breath.  Vitals in the ED showed temperature of 98, respiration rate 20, heart rate of 105, blood pressure 149/127, SpO2 96% on room air.  Serum sodium is 138, potassium 3.9, chloride 106, bicarb 25, BUN of 21, serum creatinine 1.10, EGFR greater than 60, nonfasting blood glucose 111, WBC 8.1, hemoglobin 14.3, platelets of 255.  BNP was elevated at 877.8.  High sensitive 20 was 28.  ED treatment: Clonazepam 0.5 mg p.o. one-time dose, furosemide 60 mg IV one-time dose, labetalol 5 mg IV, nitroglycerin. ----------------------------- At bedside, he is able to tell me his name, age, current year, and identify his wife at bedside.  He has been short of breath for 1.5-2 weeks, worst at night with laying down. He has been sitting up to sleep over the last two weeks. He endorses swelling of his legs. He reports walking to the bathroom gives him shortness of breath.   He denies chest pain currently. He had chest pain 2-3 weeks ago after he finished eating. He has a cough associated with epigastric pain every time he coughs. He reports it is a dry cough.  He has vomited up clear fluid from coughing too hard occassionally. He denies dysuria, hematuria, occasionally diarrhea. He deneis blood in his stool. Last diarrhea was Thursday and Friday with a regular bowel movement on Saturday. He deneis recent abx use.   Approximately daily fluid intake:  Water: 3 bottles of 20 oz water = 60 oz Juice: none Coffee: none Tea: 1 cup sweeten, (32 oz cup) Soda: 3-4 bottles of 20 oz Mountain Dew = 60 - 80 oz Etoh:  none  Social history: He lives with his wife. He dips, uses one can in 1.5 days. He denies etoh, recreational drug use. 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   ROS: Constitutional: no weight change, no fever ENT/Mouth: no sore throat, no rhinorrhea Eyes: no eye pain, no vision changes Cardiovascular: no chest pain, + dyspnea,  no edema, no palpitations Respiratory: + cough, no sputum, no wheezing Gastrointestinal: no nausea, no vomiting, no diarrhea, no constipation Genitourinary: no urinary incontinence, no dysuria, no hematuria Musculoskeletal: no arthralgias, no myalgias Skin: no skin lesions, no pruritus, Neuro: + weakness, no loss of consciousness, no syncope Psych: no anxiety, no depression, + decrease appetite Heme/Lymph: no bruising, no bleeding  ED Course: Discussed with the EDP, patient requiring hospitalization for chief concerns of acute onset heart failure exacerbation.  Assessment/Plan  Principal Problem:   Acute exacerbation of CHF (congestive heart failure) (HCC) Active Problems:   Hypertension   Elevated troponin   Anxiety   Dyspnea on exertion   Morbid obesity (HCC)   Assessment and Plan:  * Acute exacerbation of CHF (congestive heart failure) (HCC) Strict I's and O's Complete echo ordered Status post furosemide 60 mg IV one-time dose ordered Furosemide 20 mg IV twice daily, 2 doses ordered on admission   telemetry cardiac, inpatient  Morbid obesity (HCC) This meets criteria for morbid obesity based on the presence of 1 or more chronic comorbidities. Patient has 35.8. This complicates overall care and prognosis.   Dyspnea on  exertion Suspect secondary to new heart failure exacerbation  Anxiety Ativan 0.5 mg IV every 6 hours as needed for anxiety, seizure, 2 doses ordered Home clonazepam 1 mg p.o. twice daily as needed for anxiety resumed for 02/12/2023 at 7 AM  Elevated troponin Suspect secondary to demand ischemia in setting but new onset heart  failure Initial high sensitive troponin was elevated at 28 Check a second high sensitive troponin Continue telemetry monitoring  Hypertension Patient currently takes amlodipine daily, amlodipine 5 mg daily resumed Hydralazine 5 mg IV every 6 hours as needed for SBP greater 170, 5 days ordered  Chart reviewed.   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 Myer Haff   Lumbar radiculopathy, chronic    Followed by Dr. Myer Haff, has asymmetric disc herniation at L5-S1   History reviewed. No pertinent surgical history.  Social History:  reports that he has never smoked. His smokeless tobacco use includes chew. He reports that he does not drink alcohol and does not use drugs.  No Known Allergies Family History  Problem Relation Age of Onset   Hypertension Mother    Diabetes Mother    Stroke Mother    Obesity Father    Family history: Family history reviewed and not pertinent  Prior to Admission medications   Medication Sig Start Date End Date Taking? Authorizing Provider  albuterol (VENTOLIN HFA) 108 (90 Base) MCG/ACT inhaler Inhale 2 puffs into the lungs every 4 (four) hours as needed for wheezing or shortness of breath. 05/11/19   Enedina Finner, MD  clonazePAM (KLONOPIN) 1 MG tablet Take 1 tablet (1 mg total) by mouth 2 (two) times daily as needed for anxiety. 05/11/19   Enedina Finner, MD  metoprolol succinate (TOPROL-XL) 25 MG 24 hr tablet Take 2 tablets (50 mg total) by mouth daily. Take with or immediately following a meal. 05/12/19   Enedina Finner, MD  predniSONE  (DELTASONE) 10 MG tablet Take 50 mg daily --taper by 10 mg daily then stop 05/12/19   Enedina Finner, MD   Physical Exam: Vitals:   02/11/23 1500 02/11/23 1527 02/11/23 1530 02/11/23 1600  BP: 108/80  (!) 120/98 (!) 125/100  Pulse: 80  94 97  Resp:      Temp:  98.3 F (36.8 C)    TempSrc:  Oral    SpO2: 95%  97% 95%  Weight:      Height:       Constitutional: appears older than chronological age, NAD, calm Eyes: PERRL, lids and conjunctivae normal ENMT: Mucous membranes are moist. Posterior pharynx clear of any exudate or lesions. Age-appropriate dentition. Hearing appropriate Neck: normal, supple, no masses, no thyromegaly Respiratory: clear to auscultation bilaterally, no wheezing, no crackles. Normal respiratory effort. No accessory muscle use.  Cardiovascular: Regular rate and rhythm, no murmurs / rubs / gallops.  Bilateral lower extremity edema. 2+ pedal pulses. No carotid bruits.  Abdomen: Obese abdomen, no tenderness, no masses palpated, no hepatosplenomegaly. Bowel sounds positive.  Musculoskeletal: no clubbing / cyanosis. No joint deformity upper and lower extremities. Good ROM, no contractures, no atrophy. Normal muscle tone.  Skin:  no rashes, lesions, ulcers. No induration Neurologic: Sensation intact. Strength 5/5 in all 4.  Psychiatric: Normal judgment and insight. Alert and oriented x 3. Normal mood.   EKG: independently reviewed, showing sinus tachycardia with rate of 106, QTc 483  Chest x-ray on Admission: I personally reviewed and I agree with radiologist reading as below.  CT Angio Chest PE W and/or Wo Contrast Result Date: 02/11/2023 CLINICAL DATA:  Cough.  SOB. EXAM: CT ANGIOGRAPHY CHEST WITH CONTRAST TECHNIQUE: Multidetector CT imaging of the chest was performed using the standard protocol during bolus administration of intravenous contrast. Multiplanar CT image reconstructions and MIPs were obtained to evaluate the vascular anatomy. RADIATION DOSE REDUCTION: This  exam was performed according to the departmental dose-optimization program which includes automated exposure control, adjustment of the mA and/or kV according to patient size and/or use of iterative reconstruction technique. CONTRAST:  75mL OMNIPAQUE IOHEXOL 350 MG/ML SOLN COMPARISON:  None Available. FINDINGS: Cardiovascular: Cardiomegaly. No pericardial effusion. No pulmonary artery filling defects to suggest PE. No aneurysm. Mediastinum/Nodes: No enlarged mediastinal, hilar, or axillary lymph nodes. Thyroid gland, trachea, and esophagus demonstrate no significant findings. Lungs/Pleura: Diffuse ground-glass opacities consistent with pneumonitis or edema. No focal consolidation. No pneumothorax pleural effusion. Upper Abdomen: Small hiatal hernia. Musculoskeletal: No chest wall abnormality. No acute or significant osseous findings. Review of the MIP images confirms the above findings. IMPRESSION: 1. No evidence of PE. 2. Cardiomegaly. 3. Diffuse ground-glass opacities consistent with pneumonitis or edema. Electronically Signed   By: Layla Maw M.D.   On: 02/11/2023 13:32   DG Chest 2 View Result Date: 02/11/2023 CLINICAL DATA:  cp EXAM: CHEST - 2 VIEW COMPARISON:  05/10/2019. FINDINGS: Cardiac silhouette is prominent. There is pulmonary interstitial prominence with vascular congestion. No focal consolidation. No pneumothorax or pleural effusion identified. IMPRESSION: Findings suggest CHF. Electronically Signed   By: Layla Maw M.D.   On: 02/11/2023 11:12    Labs on Admission: I have personally reviewed following labs CBC: Recent Labs  Lab 02/11/23 1034  WBC 8.1  HGB 14.3  HCT 43.5  MCV 83.2  PLT 255   Basic Metabolic Panel: Recent Labs  Lab 02/11/23 1034  NA 138  K 3.9  CL 106  CO2 25  GLUCOSE 111*  BUN 21*  CREATININE 1.10  CALCIUM 9.0   GFR: Estimated Creatinine Clearance: 108.1 mL/min (by C-G formula based on SCr of 1.1 mg/dL).  Liver Function Tests: Recent Labs   Lab 02/11/23 1034  AST 27  ALT 31  ALKPHOS 53  BILITOT 1.0  PROT 7.1  ALBUMIN 4.2   Recent Labs  Lab 02/11/23 1034  LIPASE 30   Urine analysis:    Component Value Date/Time   COLORURINE AMBER (A) 08/01/2018 0228   APPEARANCEUR CLEAR (A) 08/01/2018 0228   APPEARANCEUR Clear 10/14/2012 1140   LABSPEC 1.018 08/01/2018 0228   LABSPEC 1.016 10/14/2012 1140   PHURINE 7.0 08/01/2018 0228   GLUCOSEU 50 (A) 08/01/2018 0228   GLUCOSEU Negative 10/14/2012 1140   HGBUR SMALL (A) 08/01/2018 0228   BILIRUBINUR NEGATIVE 08/01/2018 0228   BILIRUBINUR Negative 10/14/2012 1140   KETONESUR 20 (A) 08/01/2018 0228   PROTEINUR 30 (A) 08/01/2018 0228   NITRITE NEGATIVE 08/01/2018 0228   LEUKOCYTESUR NEGATIVE 08/01/2018 0228   LEUKOCYTESUR Negative 10/14/2012 1140   This document was prepared using Dragon Voice Recognition software and may include unintentional dictation errors.  Dr. Sedalia Muta [facility]  If 7PM-7AM, please contact overnight-coverage provider If 7AM-7PM, please contact day attending  provider [email or web address]  02/11/2023, 4:18 PM

## 2023-02-11 NOTE — Assessment & Plan Note (Signed)
Suspect secondary to new heart failure exacerbation

## 2023-02-11 NOTE — ED Notes (Signed)
Patient appears anxious, tearful. Patient states he has a history of anxiety and requested Clonazepam. Dr. Roxan Hockey aware.

## 2023-02-11 NOTE — Assessment & Plan Note (Addendum)
Patient currently takes amlodipine daily, amlodipine 5 mg daily resumed Hydralazine 5 mg IV every 6 hours as needed for SBP greater 170, 5 days ordered

## 2023-02-11 NOTE — ED Triage Notes (Signed)
Pt comes with c/o sob, mid sternal cp and some belly pain. 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 Pt states then last week the belly pain, sob and cp started and got worse on Friday.  Pt states yellow and green sputum.

## 2023-02-11 NOTE — ED Notes (Signed)
Patient appears more at ease. Patient states he took (2) 1mg  Klonopin at home PTA.

## 2023-02-11 NOTE — ED Notes (Signed)
Pt ambulatory to bathroom independently with steady gait.

## 2023-02-11 NOTE — Assessment & Plan Note (Signed)
Suspect secondary to demand ischemia in setting but new onset heart failure Initial high sensitive troponin was elevated at 28 Check a second high sensitive troponin Continue telemetry monitoring

## 2023-02-11 NOTE — Hospital Course (Signed)
George Richards is a 42 year old male with history of obesity, hypertension, who presents emergency department for chief concerns of shortness of breath.  Vitals in the ED showed temperature of 98, respiration rate 20, heart rate of 105, blood pressure 149/127, SpO2 96% on room air.  Serum sodium is 138, potassium 3.9, chloride 106, bicarb 25, BUN of 21, serum creatinine 1.10, EGFR greater than 60, nonfasting blood glucose 111, WBC 8.1, hemoglobin 14.3, platelets of 255.  BNP was elevated at 877.8.  High sensitive 20 was 28.  ED treatment: Clonazepam 0.5 mg p.o. one-time dose, furosemide 60 mg IV one-time dose, labetalol 5 mg IV, nitroglycerin.

## 2023-02-11 NOTE — ED Notes (Signed)
Unable to place patient on cardiac monitor at this time due to patient's anxiety. Patient is pacing in the room, but is not agitated.

## 2023-02-12 DIAGNOSIS — I509 Heart failure, unspecified: Secondary | ICD-10-CM | POA: Diagnosis not present

## 2023-02-12 LAB — BASIC METABOLIC PANEL
Anion gap: 6 (ref 5–15)
BUN: 22 mg/dL — ABNORMAL HIGH (ref 6–20)
CO2: 28 mmol/L (ref 22–32)
Calcium: 8.6 mg/dL — ABNORMAL LOW (ref 8.9–10.3)
Chloride: 104 mmol/L (ref 98–111)
Creatinine, Ser: 0.99 mg/dL (ref 0.61–1.24)
GFR, Estimated: >60 mL/min (ref >60)
Glucose, Bld: 91 mg/dL (ref 70–99)
Potassium: 3.3 mmol/L — ABNORMAL LOW (ref 3.5–5.1)
Sodium: 138 mmol/L (ref 135–145)

## 2023-02-12 LAB — CBC
HCT: 40.9 % (ref 39.0–52.0)
Hemoglobin: 13.5 g/dL (ref 13.0–17.0)
MCH: 27.2 pg (ref 26.0–34.0)
MCHC: 33 g/dL (ref 30.0–36.0)
MCV: 82.5 fL (ref 80.0–100.0)
Platelets: 232 10*3/uL (ref 150–400)
RBC: 4.96 MIL/uL (ref 4.22–5.81)
RDW: 13.8 % (ref 11.5–15.5)
WBC: 6.7 10*3/uL (ref 4.0–10.5)
nRBC: 0 % (ref 0.0–0.2)

## 2023-02-12 MED ORDER — OXYMETAZOLINE HCL 0.05 % NA SOLN
1.0000 | Freq: Two times a day (BID) | NASAL | Status: AC
  Administered 2023-02-12 – 2023-02-13 (×4): 1 via NASAL
  Filled 2023-02-12: qty 30

## 2023-02-12 MED ORDER — FUROSEMIDE 10 MG/ML IJ SOLN
20.0000 mg | Freq: Two times a day (BID) | INTRAMUSCULAR | Status: AC
  Administered 2023-02-12 – 2023-02-13 (×2): 20 mg via INTRAVENOUS
  Filled 2023-02-12 (×2): qty 4

## 2023-02-12 MED ORDER — POTASSIUM CHLORIDE CRYS ER 20 MEQ PO TBCR
40.0000 meq | EXTENDED_RELEASE_TABLET | Freq: Once | ORAL | Status: AC
  Administered 2023-02-12: 40 meq via ORAL
  Filled 2023-02-12: qty 2

## 2023-02-12 MED ORDER — LORAZEPAM 2 MG/ML IJ SOLN
1.0000 mg | Freq: Four times a day (QID) | INTRAMUSCULAR | Status: AC | PRN
  Administered 2023-02-12: 1 mg via INTRAVENOUS
  Filled 2023-02-12: qty 1

## 2023-02-12 MED ORDER — LORAZEPAM 2 MG/ML IJ SOLN: 1.0000 mg | Freq: Four times a day (QID) | INTRAMUSCULAR | Status: DC | PRN

## 2023-02-12 NOTE — ED Notes (Signed)
Pt sleeping. 

## 2023-02-12 NOTE — Progress Notes (Addendum)
PROGRESS NOTE    George Richards  VHQ:[id number] DOB: 03-31-1980 DOA: 02/11/2023 PCP: Pcp, No     Brief Narrative:  George Richards is a 42 year old male with history of obesity, hypertension, who presents emergency department for chief concerns of shortness of breath.  He reported feeling short of breath for 1.5 to 2 weeks, worse at night laying down.  He has been sitting up to sleep over the past 2 weeks.  Also admitted to swelling in his legs.  On presentation, his BNP was elevated at 877.8.  Patient was admitted for suspected CHF exacerbation.  He was started on IV Lasix.  New events last 24 hours / Subjective: Patient ambulating in the hallways without any shortness of breath.  Has been urinating well on IV Lasix, urine output not recorded.  He remains on room air.  Assessment & Plan:   Principal Problem:   Acute exacerbation of CHF (congestive heart failure) (HCC) Active Problems:   Hypertension   Elevated troponin   Anxiety   Dyspnea on exertion   Morbid obesity (HCC)   Acute exacerbation of CHF Dyspnea on exertion -Echocardiogram pending -Continue IV Lasix -Strict I's and O's, daily weight, fluid restriction diet  Demand ischemia -Secondary to above, troponin 28, 30 -Echocardiogram is pending  Hypertension -Norvasc   Anxiety -Klonopin prn   Obesity -BMI 35.87  Hypokalemia -Replace  DVT prophylaxis:  heparin injection 5,000 Units Start: 02/11/23 1430 Place TED hose Start: 02/11/23 1427  Code Status: Full Family Communication: At bedside Disposition Plan: Home Status is: Inpatient Remains inpatient appropriate because: pending echo     Antimicrobials:  Anti-infectives (From admission, onward)    None        Objective: Vitals:   02/12/23 0459 02/12/23 0751 02/12/23 1038 02/12/23 1349  BP: (!) 127/97 (!) 137/91 (!) 130/105 (!) 139/106  Pulse: 87 100 98 98  Resp: 17 17 20 19   Temp: 98.3 F (36.8 C)     TempSrc: Oral     SpO2: 96% 91% 90% 91%   Weight:      Height:       No intake or output data in the 24 hours ending 02/12/23 1606 Filed Weights   02/11/23 1032  Weight: 113.4 kg    Examination:  General exam: Appears calm and comfortable  Respiratory system: Clear to auscultation. Respiratory effort normal. No respiratory distress. No conversational dyspnea.  Cardiovascular system: S1 & S2 heard, RRR. No murmurs. No pedal edema. Gastrointestinal system: Abdomen is nondistended, soft and nontender. Normal bowel sounds heard. Central nervous system: Alert and oriented. No focal neurological deficits. Speech clear.  Extremities: Symmetric in appearance  Skin: No rashes, lesions or ulcers on exposed skin  Psychiatry: Judgement and insight appear normal. Mood & affect appropriate.   Data Reviewed: I have personally reviewed following labs and imaging studies  CBC: Recent Labs  Lab 02/11/23 1034 02/12/23 0456  WBC 8.1 6.7  HGB 14.3 13.5  HCT 43.5 40.9  MCV 83.2 82.5  PLT 255 232   Basic Metabolic Panel: Recent Labs  Lab 02/11/23 1034 02/12/23 0456  NA 138 138  K 3.9 3.3*  CL 106 104  CO2 25 28  GLUCOSE 111* 91  BUN 21* 22*  CREATININE 1.10 0.99  CALCIUM 9.0 8.6*   GFR: Estimated Creatinine Clearance: 120.1 mL/min (by C-G formula based on SCr of 0.99 mg/dL). Liver Function Tests: Recent Labs  Lab 02/11/23 1034  AST 27  ALT 31  ALKPHOS 53  BILITOT 1.0  PROT 7.1  ALBUMIN 4.2   Recent Labs  Lab 02/11/23 1034  LIPASE 30   No results for input(s): "AMMONIA" in the last 168 hours. Coagulation Profile: No results for input(s): "INR", "PROTIME" in the last 168 hours. Cardiac Enzymes: No results for input(s): "CKTOTAL", "CKMB", "CKMBINDEX", "TROPONINI" in the last 168 hours. BNP (last 3 results) No results for input(s): "PROBNP" in the last 8760 hours. HbA1C: No results for input(s): "HGBA1C" in the last 72 hours. CBG: No results for input(s): "GLUCAP" in the last 168 hours. Lipid Profile: No  results for input(s): "CHOL", "HDL", "LDLCALC", "TRIG", "CHOLHDL", "LDLDIRECT" in the last 72 hours. Thyroid Function Tests: No results for input(s): "TSH", "T4TOTAL", "FREET4", "T3FREE", "THYROIDAB" in the last 72 hours. Anemia Panel: No results for input(s): "VITAMINB12", "FOLATE", "FERRITIN", "TIBC", "IRON", "RETICCTPCT" in the last 72 hours. Sepsis Labs: No results for input(s): "PROCALCITON", "LATICACIDVEN" in the last 168 hours.  No results found for this or any previous visit (from the past 240 hours).    Radiology Studies: CT Angio Chest PE W and/or Wo Contrast Result Date: 02/11/2023 CLINICAL DATA:  Cough.  SOB. EXAM: CT ANGIOGRAPHY CHEST WITH CONTRAST TECHNIQUE: Multidetector CT imaging of the chest was performed using the standard protocol during bolus administration of intravenous contrast. Multiplanar CT image reconstructions and MIPs were obtained to evaluate the vascular anatomy. RADIATION DOSE REDUCTION: This exam was performed according to the departmental dose-optimization program which includes automated exposure control, adjustment of the mA and/or kV according to patient size and/or use of iterative reconstruction technique. CONTRAST:  75mL OMNIPAQUE IOHEXOL 350 MG/ML SOLN COMPARISON:  None Available. FINDINGS: Cardiovascular: Cardiomegaly. No pericardial effusion. No pulmonary artery filling defects to suggest PE. No aneurysm. Mediastinum/Nodes: No enlarged mediastinal, hilar, or axillary lymph nodes. Thyroid gland, trachea, and esophagus demonstrate no significant findings. Lungs/Pleura: Diffuse ground-glass opacities consistent with pneumonitis or edema. No focal consolidation. No pneumothorax pleural effusion. Upper Abdomen: Small hiatal hernia. Musculoskeletal: No chest wall abnormality. No acute or significant osseous findings. Review of the MIP images confirms the above findings. IMPRESSION: 1. No evidence of PE. 2. Cardiomegaly. 3. Diffuse ground-glass opacities consistent  with pneumonitis or edema. Electronically Signed   By: Layla Maw M.D.   On: 02/11/2023 13:32   DG Chest 2 View Result Date: 02/11/2023 CLINICAL DATA:  cp EXAM: CHEST - 2 VIEW COMPARISON:  05/10/2019. FINDINGS: Cardiac silhouette is prominent. There is pulmonary interstitial prominence with vascular congestion. No focal consolidation. No pneumothorax or pleural effusion identified. IMPRESSION: Findings suggest CHF. Electronically Signed   By: Layla Maw M.D.   On: 02/11/2023 11:12      Scheduled Meds:  amLODipine  5 mg Oral Daily   heparin  5,000 Units Subcutaneous Q8H   oxymetazoline  1 spray Each Nare BID   Continuous Infusions:   LOS: 1 day   Time spent: 35 minutes   Noralee Stain, DO [facility] 02/12/2023, 4:06 PM   Available via Epic secure chat 7am-7pm After these hours, please refer to coverage provider listed on [email or web address]

## 2023-02-12 NOTE — ED Notes (Signed)
Pt ambulatory independently with steady gait to bathroom

## 2023-02-12 NOTE — ED Notes (Signed)
Pt is sleeping

## 2023-02-12 NOTE — ED Notes (Signed)
Pt pacing up and down hallway. Pt requesting anti-anxiety medication

## 2023-02-12 NOTE — ED Notes (Signed)
Patient ambulating independently with steady gait around the unit. Pt tolerating activity without difficulty.

## 2023-02-13 ENCOUNTER — Inpatient Hospital Stay (HOSPITAL_COMMUNITY)
Admit: 2023-02-13 | Discharge: 2023-02-13 | Disposition: A | Discharge status: Home / Self Care | Payer: Managed Care, Other (non HMO) | Attending: Internal Medicine | Admitting: Internal Medicine

## 2023-02-13 DIAGNOSIS — I5021 Acute systolic (congestive) heart failure: Secondary | ICD-10-CM | POA: Diagnosis not present

## 2023-02-13 DIAGNOSIS — I5043 Acute on chronic combined systolic (congestive) and diastolic (congestive) heart failure: Secondary | ICD-10-CM

## 2023-02-13 DIAGNOSIS — R7989 Other specified abnormal findings of blood chemistry: Secondary | ICD-10-CM

## 2023-02-13 DIAGNOSIS — R0609 Other forms of dyspnea: Secondary | ICD-10-CM

## 2023-02-13 LAB — BASIC METABOLIC PANEL
Anion gap: 9 (ref 5–15)
BUN: 21 mg/dL — ABNORMAL HIGH (ref 6–20)
CO2: 24 mmol/L (ref 22–32)
Calcium: 8.5 mg/dL — ABNORMAL LOW (ref 8.9–10.3)
Chloride: 106 mmol/L (ref 98–111)
Creatinine, Ser: 1.05 mg/dL (ref 0.61–1.24)
GFR, Estimated: >60 mL/min (ref >60)
Glucose, Bld: 111 mg/dL — ABNORMAL HIGH (ref 70–99)
Potassium: 3.1 mmol/L — ABNORMAL LOW (ref 3.5–5.1)
Sodium: 139 mmol/L (ref 135–145)

## 2023-02-13 LAB — ECHOCARDIOGRAM COMPLETE
AR max vel: 2.31 cm2
AV Area VTI: 2.56 cm2
AV Area mean vel: 1.9 cm2
AV Mean grad: 2 mm[Hg]
AV Peak grad: 3.1 mm[Hg]
Ao pk vel: 0.88 m/s
Area-P 1/2: 5.66 cm2
Calc EF: 17.3 %
Height: 70 in
MV VTI: 1.25 cm2
S' Lateral: 7.3 cm
Single Plane A2C EF: 16.1 %
Single Plane A4C EF: 27.2 %
Weight: 4000 [oz_av]

## 2023-02-13 MED ORDER — SPIRONOLACTONE 12.5 MG HALF TABLET
12.5000 mg | ORAL_TABLET | Freq: Every day | ORAL | Status: DC
  Administered 2023-02-13 – 2023-02-14 (×2): 12.5 mg via ORAL
  Filled 2023-02-13 (×2): qty 1

## 2023-02-13 MED ORDER — POTASSIUM CHLORIDE CRYS ER 20 MEQ PO TBCR
40.0000 meq | EXTENDED_RELEASE_TABLET | ORAL | Status: AC
  Administered 2023-02-13 (×2): 40 meq via ORAL
  Filled 2023-02-13 (×2): qty 2

## 2023-02-13 MED ORDER — FUROSEMIDE 10 MG/ML IJ SOLN
20.0000 mg | Freq: Two times a day (BID) | INTRAMUSCULAR | Status: DC
  Administered 2023-02-13 – 2023-02-14 (×2): 20 mg via INTRAVENOUS
  Filled 2023-02-13 (×2): qty 2

## 2023-02-13 MED ORDER — SACUBITRIL-VALSARTAN 24-26 MG PO TABS
1.0000 | ORAL_TABLET | Freq: Two times a day (BID) | ORAL | Status: DC
  Administered 2023-02-13 – 2023-02-14 (×2): 1 via ORAL
  Filled 2023-02-13 (×3): qty 1

## 2023-02-13 MED ORDER — FUROSEMIDE 10 MG/ML IJ SOLN: 20.0000 mg | Freq: Once | INTRAMUSCULAR | Status: DC

## 2023-02-13 NOTE — Plan of Care (Signed)
  Problem: Education: Goal: Knowledge of General Education information will improve Description: Including pain rating scale, medication(s)/side effects and non-pharmacologic comfort measures 02/13/2023 2316 by Myles Gip, RN Outcome: Progressing 02/13/2023 2309 by Myles Gip, RN Outcome: Progressing   Problem: Clinical Measurements: Goal: Will remain free from infection Outcome: Progressing   Problem: Clinical Measurements: Goal: Respiratory complications will improve 02/13/2023 2316 by Myles Gip, RN Outcome: Progressing 02/13/2023 2309 by Myles Gip, RN Outcome: Progressing   Problem: Clinical Measurements: Goal: Cardiovascular complication will be avoided 02/13/2023 2316 by Myles Gip, RN Outcome: Progressing 02/13/2023 2309 by Myles Gip, RN Outcome: Progressing   Problem: Activity: Goal: Risk for activity intolerance will decrease 02/13/2023 2316 by Myles Gip, RN Outcome: Progressing 02/13/2023 2309 by Myles Gip, RN Outcome: Progressing   Problem: Elimination: Goal: Will not experience complications related to bowel motility Outcome: Progressing   Problem: Pain Management: Goal: General experience of comfort will improve 02/13/2023 2316 by Myles Gip, RN Outcome: Progressing 02/13/2023 2309 by Myles Gip, RN Outcome: Progressing   Problem: Safety: Goal: Ability to remain free from injury will improve 02/13/2023 2316 by Myles Gip, RN Outcome: Progressing 02/13/2023 2309 by Myles Gip, RN Outcome: Progressing

## 2023-02-13 NOTE — Progress Notes (Signed)
PROGRESS NOTE    [name]  [name] DOB: 1980/08/08 DOA: 02/11/2023 PCP: Pcp, No     Brief Narrative:  George Richards is a 42 year old male with history of obesity, hypertension, who presents emergency department for chief concerns of shortness of breath.  He reported feeling short of breath for 1.5 to 2 weeks, worse at night laying down.  He has been sitting up to sleep over the past 2 weeks.  Also admitted to swelling in his legs.  On presentation, his BNP was elevated at 877.8.  Patient was admitted for suspected CHF exacerbation.  He was started on IV Lasix.  New events last 24 hours / Subjective: Echocardiogram showed EF 20% with grade 2 dd. Patient without new complaints, symptoms of orthopnea and DOE improved on lasix.   Assessment & Plan:   Principal Problem:   Acute exacerbation of CHF (congestive heart failure) (HCC) Active Problems:   Hypertension   Elevated troponin   Anxiety   Dyspnea on exertion   Morbid obesity (HCC)   Acute combined systolic and diastolic CHF Dyspnea on exertion -Echocardiogram showed EF 20% with grade 2 dd -Continue IV Lasix -Strict I's and O's, daily weight, fluid restriction diet -Cardiology consulted   Demand ischemia -Secondary to above, troponin 28, 30  Hypertension -Norvasc   Anxiety -Klonopin prn   Obesity -BMI 35.87  Hypokalemia -Replace  DVT prophylaxis:  heparin injection 5,000 Units Start: 02/11/23 1430 Place TED hose Start: 02/11/23 1427  Code Status: Full Family Communication: At bedside Disposition Plan: Home Status is: Inpatient Remains inpatient appropriate because: pending cardiology plan     Antimicrobials:  Anti-infectives (From admission, onward)    None        Objective: Vitals:   02/13/23 0543 02/13/23 0824 02/13/23 0913 02/13/23 1022  BP: (!) 135/105 (!) 133/96 (!) 131/107 (!) 128/102  Pulse: 89 88  93  Resp: 19  18 20   Temp: 98.4 F (36.9 C) (!) 97.5 F (36.4 C) 97.7 F (36.5  C) 98.8 F (37.1 C)  TempSrc: Oral     SpO2: 94% 96%  97%  Weight:      Height:       No intake or output data in the 24 hours ending 02/13/23 1337 Filed Weights   02/11/23 1032  Weight: 113.4 kg    Examination:  General exam: Appears calm and comfortable  Respiratory system: Clear to auscultation. Respiratory effort normal. No respiratory distress. No conversational dyspnea. On room air  Cardiovascular system: S1 & S2 heard, RRR. No murmurs. No pedal edema. Gastrointestinal system: Abdomen is nondistended, soft and nontender. Normal bowel sounds heard. Central nervous system: Alert and oriented. No focal neurological deficits. Speech clear.  Extremities: Symmetric in appearance  Skin: No rashes, lesions or ulcers on exposed skin  Psychiatry: Judgement and insight appear normal. Mood & affect appropriate.   Data Reviewed: I have personally reviewed following labs and imaging studies  CBC: Recent Labs  Lab 02/11/23 1034 02/12/23 0456  WBC 8.1 6.7  HGB 14.3 13.5  HCT 43.5 40.9  MCV 83.2 82.5  PLT 255 232   Basic Metabolic Panel: Recent Labs  Lab 02/11/23 1034 02/12/23 0456 02/13/23 0507  NA 138 138 139  K 3.9 3.3* 3.1*  CL 106 104 106  CO2 25 28 24   GLUCOSE 111* 91 111*  BUN 21* 22* 21*  CREATININE 1.10 0.99 1.05  CALCIUM 9.0 8.6* 8.5*   GFR: Estimated Creatinine Clearance: 113.3 mL/min (by C-G formula based on  SCr of 1.05 mg/dL). Liver Function Tests: Recent Labs  Lab 02/11/23 1034  AST 27  ALT 31  ALKPHOS 53  BILITOT 1.0  PROT 7.1  ALBUMIN 4.2   Recent Labs  Lab 02/11/23 1034  LIPASE 30   No results for input(s): "AMMONIA" in the last 168 hours. Coagulation Profile: No results for input(s): "INR", "PROTIME" in the last 168 hours. Cardiac Enzymes: No results for input(s): "CKTOTAL", "CKMB", "CKMBINDEX", "TROPONINI" in the last 168 hours. BNP (last 3 results) No results for input(s): "PROBNP" in the last 8760 hours. HbA1C: No results for  input(s): "HGBA1C" in the last 72 hours. CBG: No results for input(s): "GLUCAP" in the last 168 hours. Lipid Profile: No results for input(s): "CHOL", "HDL", "LDLCALC", "TRIG", "CHOLHDL", "LDLDIRECT" in the last 72 hours. Thyroid Function Tests: No results for input(s): "TSH", "T4TOTAL", "FREET4", "T3FREE", "THYROIDAB" in the last 72 hours. Anemia Panel: No results for input(s): "VITAMINB12", "FOLATE", "FERRITIN", "TIBC", "IRON", "RETICCTPCT" in the last 72 hours. Sepsis Labs: No results for input(s): "PROCALCITON", "LATICACIDVEN" in the last 168 hours.  No results found for this or any previous visit (from the past 240 hours).    Radiology Studies: ECHOCARDIOGRAM COMPLETE Result Date: 02/13/2023    ECHOCARDIOGRAM REPORT   Patient Name:   George Richards Date of Exam: 02/13/2023 Medical Rec #:  [id number]    Height:       70.0 in Accession #:    [id number]   Weight:       250.0 lb Date of Birth:  10-17-1980    BSA:          2.295 m Patient Age:    42 years     BP:           135/105 mmHg Patient Gender: M            HR:           89 bpm. Exam Location:  [facility] Procedure: 2D Echo, Cardiac Doppler and Color Doppler Indications:     Dyspnea R06.00                  Elevated troponin  History:         Patient has no prior history of Echocardiogram examinations.                  Risk Factors:Hypertension. Anxiety.  Sonographer:     Cristela Blue Referring Phys:  [id number] Silas Muff Diagnosing Phys: Yvonne Kendall MD IMPRESSIONS  1. Left ventricular ejection fraction, by estimation, is 20 to 25%. The left ventricle has severely decreased function. The left ventricle demonstrates global hypokinesis. The left ventricular internal cavity size was severely dilated. Left ventricular diastolic parameters are consistent with Grade II diastolic dysfunction (pseudonormalization). Elevated left atrial pressure.  2. Right ventricular systolic function is mildly reduced. The right ventricular size is mildly enlarged.  Tricuspid regurgitation signal is inadequate for assessing PA pressure.  3. Left atrial size was moderately dilated.  4. Right atrial size was mildly dilated.  5. The mitral valve is normal in structure. Mild to moderate mitral valve regurgitation. No evidence of mitral stenosis.  6. The aortic valve is tricuspid. Aortic valve regurgitation is not visualized. No aortic stenosis is present.  7. The inferior vena cava is normal in size with <50% respiratory variability, suggesting right atrial pressure of 8 mmHg. FINDINGS  Left Ventricle: Left ventricular ejection fraction, by estimation, is 20 to 25%. The left ventricle has severely decreased function. The  left ventricle demonstrates global hypokinesis. The left ventricular internal cavity size was severely dilated. There is no left ventricular hypertrophy. Left ventricular diastolic parameters are consistent with Grade II diastolic dysfunction (pseudonormalization). Elevated left atrial pressure. Right Ventricle: The right ventricular size is mildly enlarged. No increase in right ventricular wall thickness. Right ventricular systolic function is mildly reduced. Tricuspid regurgitation signal is inadequate for assessing PA pressure. Left Atrium: Left atrial size was moderately dilated. Right Atrium: Right atrial size was mildly dilated. Pericardium: There is no evidence of pericardial effusion. Mitral Valve: The mitral valve is normal in structure. Mild to moderate mitral valve regurgitation. No evidence of mitral valve stenosis. MV peak gradient, 6.2 mmHg. The mean mitral valve gradient is 4.0 mmHg. Tricuspid Valve: The tricuspid valve is normal in structure. Tricuspid valve regurgitation is trivial. Aortic Valve: The aortic valve is tricuspid. Aortic valve regurgitation is not visualized. No aortic stenosis is present. Aortic valve mean gradient measures 2.0 mmHg. Aortic valve peak gradient measures 3.1 mmHg. Aortic valve area, by VTI measures 2.56 cm. Pulmonic  Valve: The pulmonic valve was not well visualized. Pulmonic valve regurgitation is not visualized. No evidence of pulmonic stenosis. Aorta: The aortic root is normal in size and structure. Pulmonary Artery: The pulmonary artery is not well seen. Venous: The inferior vena cava is normal in size with less than 50% respiratory variability, suggesting right atrial pressure of 8 mmHg. IAS/Shunts: No atrial level shunt detected by color flow Doppler.  LEFT VENTRICLE PLAX 2D LVIDd:         7.70 cm      Diastology LVIDs:         7.30 cm      LV e' medial:    3.48 cm/s LV PW:         1.00 cm      LV E/e' medial:  30.2 LV IVS:        0.93 cm      LV e' lateral:   5.44 cm/s LVOT diam:     2.20 cm      LV E/e' lateral: 19.3 LV SV:         30 LV SV Index:   13 LVOT Area:     3.80 cm  LV Volumes (MOD) LV vol d, MOD A2C: 429.0 ml LV vol d, MOD A4C: 393.0 ml LV vol s, MOD A2C: 360.0 ml LV vol s, MOD A4C: 286.0 ml LV SV MOD A2C:     69.0 ml LV SV MOD A4C:     393.0 ml LV SV MOD BP:      68.2 ml RIGHT VENTRICLE RV Basal diam:  4.40 cm RV Mid diam:    4.20 cm RV S prime:     12.50 cm/s TAPSE (M-mode): 1.8 cm LEFT ATRIUM            Index        RIGHT ATRIUM           Index LA diam:      4.60 cm  2.00 cm/m   RA Area:     22.50 cm LA Vol (A2C): 64.3 ml  28.02 ml/m  RA Volume:   75.10 ml  32.72 ml/m LA Vol (A4C): 113.0 ml 49.24 ml/m  AORTIC VALVE AV Area (Vmax):    2.31 cm AV Area (Vmean):   1.90 cm AV Area (VTI):     2.56 cm AV Vmax:           88.20 cm/s AV  Vmean:          64.600 cm/s AV VTI:            0.116 m AV Peak Grad:      3.1 mmHg AV Mean Grad:      2.0 mmHg LVOT Vmax:         53.50 cm/s LVOT Vmean:        32.300 cm/s LVOT VTI:          0.078 m LVOT/AV VTI ratio: 0.67  AORTA Ao Root diam: 3.20 cm MITRAL VALVE MV Area (PHT): 5.66 cm     SHUNTS MV Area VTI:   1.25 cm     Systemic VTI:  0.08 m MV Peak grad:  6.2 mmHg     Systemic Diam: 2.20 cm MV Mean grad:  4.0 mmHg MV Vmax:       1.24 m/s MV Vmean:      90.2 cm/s MV  Decel Time: 134 msec MV E velocity: 105.00 cm/s MV A velocity: 60.80 cm/s MV E/A ratio:  1.73 Cristal Deer End MD Electronically signed by Yvonne Kendall MD Signature Date/Time: 02/13/2023/12:25:29 PM    Final       Scheduled Meds:  amLODipine  5 mg Oral Daily   [START ON 02/14/2023] furosemide  20 mg Intravenous Once   heparin  5,000 Units Subcutaneous Q8H   oxymetazoline  1 spray Each Nare BID   Continuous Infusions:   LOS: 2 days   Time spent: 35 minutes   Noralee Stain, DO [facility] 02/13/2023, 1:37 PM   Available via Epic secure chat 7am-7pm After these hours, please refer to coverage provider listed on [email or web address]

## 2023-02-13 NOTE — Progress Notes (Signed)
*  PRELIMINARY RESULTS* Echocardiogram 2D Echocardiogram has been performed.  George Richards 02/13/2023, 8:46 AM

## 2023-02-13 NOTE — Plan of Care (Signed)
  Problem: Education: Goal: Knowledge of General Education information will improve Description: Including pain rating scale, medication(s)/side effects and non-pharmacologic comfort measures Outcome: Progressing   Problem: Clinical Measurements: Goal: Respiratory complications will improve Outcome: Progressing   Problem: Clinical Measurements: Goal: Cardiovascular complication will be avoided Outcome: Progressing   Problem: Activity: Goal: Risk for activity intolerance will decrease Outcome: Progressing   Problem: Elimination: Goal: Will not experience complications related to bowel motility Outcome: Progressing   Problem: Pain Management: Goal: General experience of comfort will improve Outcome: Progressing   Problem: Safety: Goal: Ability to remain free from injury will improve Outcome: Progressing

## 2023-02-13 NOTE — ED Notes (Signed)
Pt noncompliant with cardiac leads. Pt education provided.

## 2023-02-13 NOTE — Consult Note (Signed)
Cardiology Consultation   Patient ID: LEMARIO GIARRAPUTO MRN: [id number]; DOB: [birth year]  Admit date: 02/11/2023 Date of Consult: 02/13/2023  PCP:  Oneita Hurt, No   [facility] Providers Cardiologist: New  Patient Profile:   George Richards is a 42 y.o. male with a hx of obesity and HTN who is being seen 02/13/2023 for the evaluation of acute heart failure at the request of Dr. Alvino Chapel.  History of Present Illness:   George Richards  has no prior cardiac history. No h/o MI or stent. Says dad had heart issues, but unsure what kind. No alcohol, tobacco , drug or alcohol history. He does not see a doctor regularly.   The patient presented to the ER 02/11/23 for SOB, chest pain and lower leg edema. Reports symptoms were worsening over the last 1-2 weeks. Says symptoms were worse with exertion. Also reported orthopnea. Chest pain was in the center of the chest and pressure like. He was having trouble walking to the bathroom which is about 20 feet. When he rested, symptoms improved.   In the ER BP 149/127, HR 105, RR 20, afebrile, 96%O2. Labs showed sodium 138, K 3.9, chloride 106, bicarb 25, BUN 21, Scr 1.1, BG 111, WBC 8.1, Hgb 14.3, plt 255. BNP 877. HS trop 20>28. CXR showed CHF. EKG showed St, 106bpm,  with LVH and repol, nonspecific St/T wave changes. CTA chest showed no PE. HE was given IV lasix, IV labetalol and admitted.    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 Myer Haff   Lumbar radiculopathy, chronic    Followed by Dr. Myer Haff, has asymmetric disc herniation at L5-S1    History reviewed. No pertinent surgical history.   Home Medications:  Prior to Admission medications   Medication Sig Start Date End Date Taking? Authorizing Provider  albuterol (VENTOLIN HFA) 108 (90 Base) MCG/ACT inhaler Inhale 2 puffs into the lungs every 4  (four) hours as needed for wheezing or shortness of breath. 05/11/19  Yes Enedina Finner, MD  amLODipine (NORVASC) 10 MG tablet Take 1 tablet by mouth daily. 09/30/21  Yes [provider]  clonazePAM (KLONOPIN) 1 MG tablet Take 1 tablet (1 mg total) by mouth 2 (two) times daily as needed for anxiety. 05/11/19  Yes Enedina Finner, MD  metoprolol succinate (TOPROL-XL) 25 MG 24 hr tablet Take 2 tablets (50 mg total) by mouth daily. Take with or immediately following a meal. 05/12/19  Yes Enedina Finner, MD  predniSONE (DELTASONE) 10 MG tablet Take 50 mg daily --taper by 10 mg daily then stop Patient not taking: Reported on 02/12/2023 05/12/19   Enedina Finner, MD    Inpatient Medications: Scheduled Meds:  amLODipine  5 mg Oral Daily   furosemide  20 mg Intravenous BID   heparin  5,000 Units Subcutaneous Q8H   oxymetazoline  1 spray Each Nare BID   potassium chloride  40 mEq Oral Q4H   Continuous Infusions:  PRN Meds: acetaminophen **OR** acetaminophen, albuterol, clonazePAM, hydrALAZINE, nitroGLYCERIN, ondansetron **OR** ondansetron (ZOFRAN) IV, senna-docusate  Allergies:   No Known Allergies  Social History:   Social History   Socioeconomic History   Marital status: Married    Spouse name: Not on file   Number of children: Not on file   Years of education: Not on file   Highest  education level: Not on file  Occupational History   Not on file  Tobacco Use   Smoking status: Never   Smokeless tobacco: Current    Types: Chew  Vaping Use   Vaping status: Never Used  Substance and Sexual Activity   Alcohol use: No   Drug use: No   Sexual activity: Yes    Partners: Female  Other Topics Concern   Not on file  Social History Narrative   Not on file   Social Drivers of Health   Financial Resource Strain: Not on file  Food Insecurity: Not on file  Transportation Needs: Not on file  Physical Activity: Not on file  Stress: Not on file  Social Connections: Not on file  Intimate  Partner Violence: Not on file    Family History:    Family History  Problem Relation Age of Onset   Hypertension Mother    Diabetes Mother    Stroke Mother    Obesity Father      ROS:  Please see the history of present illness.   All other ROS reviewed and negative.     Physical Exam/Data:   Vitals:   02/12/23 2111 02/12/23 2355 02/12/23 2356 02/13/23 0543  BP: (!) 153/116  (!) 140/109 (!) 135/105  Pulse: 80  94 89  Resp: 20  18 19   Temp:  97.8 F (36.6 C)  98.4 F (36.9 C)  TempSrc:  Axillary  Oral  SpO2: 92%  94% 94%  Weight:      Height:       No intake or output data in the 24 hours ending 02/13/23 0801    02/11/2023   10:32 AM 05/10/2019    3:32 AM 08/01/2018    2:22 AM  Last 3 Weights  Weight (lbs) 250 lb 250 lb 260 lb  Weight (kg) 113.399 kg 113.399 kg 117.935 kg     Body mass index is 35.87 kg/m.  General:  Well nourished, well developed, in no acute distress HEENT: normal Neck: no JVD Vascular: No carotid bruits; Distal pulses 2+ bilaterally Cardiac:  normal S1, S2; RRR; no murmur  Lungs:  clear to auscultation bilaterally, no wheezing, rhonchi or rales  Abd: soft, nontender, no hepatomegaly  Ext: no edema Musculoskeletal:  No deformities, BUE and BLE strength normal and equal Skin: warm and dry  Neuro:  CNs 2-12 intact, no focal abnormalities noted Psych:  Normal affect   EKG:  The EKG was personally reviewed and demonstrates:  St, 106bpm,  with LVH and repol, nonspecific St/T wave changes Telemetry:  Telemetry was personally reviewed and demonstrates:  NSR, 80s  Relevant CV Studies:  Echo ordered  Laboratory Data:  High Sensitivity Troponin:   Recent Labs  Lab 02/11/23 1034 02/11/23 1612  TROPONINIHS 28* 30*     Chemistry Recent Labs  Lab 02/11/23 1034 02/12/23 0456 02/13/23 0507  NA 138 138 139  K 3.9 3.3* 3.1*  CL 106 104 106  CO2 25 28 24   GLUCOSE 111* 91 111*  BUN 21* 22* 21*  CREATININE 1.10 0.99 1.05  CALCIUM 9.0 8.6*  8.5*  GFRNONAA >60 >60 >60  ANIONGAP 7 6 9     Recent Labs  Lab 02/11/23 1034  PROT 7.1  ALBUMIN 4.2  AST 27  ALT 31  ALKPHOS 53  BILITOT 1.0   Lipids No results for input(s): "CHOL", "TRIG", "HDL", "LABVLDL", "LDLCALC", "CHOLHDL" in the last 168 hours.  Hematology Recent Labs  Lab 02/11/23 1034 02/12/23 7253  WBC 8.1 6.7  RBC 5.23 4.96  HGB 14.3 13.5  HCT 43.5 40.9  MCV 83.2 82.5  MCH 27.3 27.2  MCHC 32.9 33.0  RDW 14.0 13.8  PLT 255 232   Thyroid No results for input(s): "TSH", "FREET4" in the last 168 hours.  BNP Recent Labs  Lab 02/11/23 1034  BNP 877.8*    DDimer No results for input(s): "DDIMER" in the last 168 hours.   Radiology/Studies:  CT Angio Chest PE W and/or Wo Contrast Result Date: 02/11/2023 CLINICAL DATA:  Cough.  SOB. EXAM: CT ANGIOGRAPHY CHEST WITH CONTRAST TECHNIQUE: Multidetector CT imaging of the chest was performed using the standard protocol during bolus administration of intravenous contrast. Multiplanar CT image reconstructions and MIPs were obtained to evaluate the vascular anatomy. RADIATION DOSE REDUCTION: This exam was performed according to the departmental dose-optimization program which includes automated exposure control, adjustment of the mA and/or kV according to patient size and/or use of iterative reconstruction technique. CONTRAST:  75mL OMNIPAQUE IOHEXOL 350 MG/ML SOLN COMPARISON:  None Available. FINDINGS: Cardiovascular: Cardiomegaly. No pericardial effusion. No pulmonary artery filling defects to suggest PE. No aneurysm. Mediastinum/Nodes: No enlarged mediastinal, hilar, or axillary lymph nodes. Thyroid gland, trachea, and esophagus demonstrate no significant findings. Lungs/Pleura: Diffuse ground-glass opacities consistent with pneumonitis or edema. No focal consolidation. No pneumothorax pleural effusion. Upper Abdomen: Small hiatal hernia. Musculoskeletal: No chest wall abnormality. No acute or significant osseous findings.  Review of the MIP images confirms the above findings. IMPRESSION: 1. No evidence of PE. 2. Cardiomegaly. 3. Diffuse ground-glass opacities consistent with pneumonitis or edema. Electronically Signed   By: Layla Maw M.D.   On: 02/11/2023 13:32   DG Chest 2 View Result Date: 02/11/2023 CLINICAL DATA:  cp EXAM: CHEST - 2 VIEW COMPARISON:  05/10/2019. FINDINGS: Cardiac silhouette is prominent. There is pulmonary interstitial prominence with vascular congestion. No focal consolidation. No pneumothorax or pleural effusion identified. IMPRESSION: Findings suggest CHF. Electronically Signed   By: Layla Maw M.D.   On: 02/11/2023 11:12     Assessment and Plan:   Acute heart failure - presented with 2 weeks of shortness of breath, chest pain, lower leg edema, orthopnea found to have elevated BNP (800s), mildly elevated troponin. CXR showed CHF - IV lasix 20mg  daily - echo ordered - no I/Os recorded - does not appear significantly volume up on exam - monitor kidney function, daily weights with diuresis  Elevated troponin Chest pain - HS trop mildly elevated - he reported exertional chest pain - echo ordered - further ischemic work-up pending echo  HTN - Bps normal - amlodipine 5mg  daily  Hypokalemia - supplement K with diuresis  For questions or updates, please contact [facility] Please consult [email or web address] for contact info under    Signed, Donnie Panik David Stall, PA-C  02/13/2023 8:01 AM

## 2023-02-14 ENCOUNTER — Inpatient Hospital Stay (HOSPITAL_COMMUNITY): Payer: Managed Care, Other (non HMO)

## 2023-02-14 ENCOUNTER — Telehealth (HOSPITAL_COMMUNITY): Payer: Self-pay | Admitting: Pharmacy Technician

## 2023-02-14 ENCOUNTER — Other Ambulatory Visit (HOSPITAL_COMMUNITY): Payer: Self-pay

## 2023-02-14 ENCOUNTER — Encounter
Admission: EM | Disposition: A | Discharge status: Home / Self Care | Payer: Self-pay | Source: Home / Self Care | Attending: Internal Medicine

## 2023-02-14 DIAGNOSIS — G4733 Obstructive sleep apnea (adult) (pediatric): Secondary | ICD-10-CM | POA: Diagnosis not present

## 2023-02-14 DIAGNOSIS — I10 Essential (primary) hypertension: Secondary | ICD-10-CM

## 2023-02-14 DIAGNOSIS — I5041 Acute combined systolic (congestive) and diastolic (congestive) heart failure: Secondary | ICD-10-CM

## 2023-02-14 DIAGNOSIS — I5021 Acute systolic (congestive) heart failure: Secondary | ICD-10-CM

## 2023-02-14 DIAGNOSIS — E6689 Other obesity not elsewhere classified: Secondary | ICD-10-CM

## 2023-02-14 HISTORY — PX: RIGHT AND LEFT HEART CATH: CATH118262

## 2023-02-14 LAB — POCT I-STAT 7, (LYTES, BLD GAS, ICA,H+H)
Acid-base deficit: 2 mmol/L (ref 0.0–2.0)
Bicarbonate: 22.5 mmol/L (ref 20.0–28.0)
Calcium, Ion: 1.09 mmol/L — ABNORMAL LOW (ref 1.15–1.40)
HCT: 43 % (ref 39.0–52.0)
Hemoglobin: 14.6 g/dL (ref 13.0–17.0)
O2 Saturation: 92 %
Potassium: 4 mmol/L (ref 3.5–5.1)
Sodium: 144 mmol/L (ref 135–145)
TCO2: 24 mmol/L (ref 22–32)
pCO2 arterial: 35.6 mm[Hg] (ref 32–48)
pH, Arterial: 7.409 (ref 7.35–7.45)
pO2, Arterial: 64 mm[Hg] — ABNORMAL LOW (ref 83–108)

## 2023-02-14 LAB — POCT I-STAT EG7
Acid-Base Excess: 1 mmol/L (ref 0.0–2.0)
Acid-Base Excess: 1 mmol/L (ref 0.0–2.0)
Bicarbonate: 26.7 mmol/L (ref 20.0–28.0)
Bicarbonate: 26.8 mmol/L (ref 20.0–28.0)
Calcium, Ion: 1.26 mmol/L (ref 1.15–1.40)
Calcium, Ion: 1.27 mmol/L (ref 1.15–1.40)
HCT: 46 % (ref 39.0–52.0)
HCT: 47 % (ref 39.0–52.0)
Hemoglobin: 15.6 g/dL (ref 13.0–17.0)
Hemoglobin: 16 g/dL (ref 13.0–17.0)
O2 Saturation: 51 %
O2 Saturation: 55 %
Potassium: 4.3 mmol/L (ref 3.5–5.1)
Potassium: 4.3 mmol/L (ref 3.5–5.1)
Sodium: 141 mmol/L (ref 135–145)
Sodium: 141 mmol/L (ref 135–145)
TCO2: 28 mmol/L (ref 22–32)
TCO2: 28 mmol/L (ref 22–32)
pCO2, Ven: 45.3 mm[Hg] (ref 44–60)
pCO2, Ven: 45.5 mm[Hg] (ref 44–60)
pH, Ven: 7.378 (ref 7.25–7.43)
pH, Ven: 7.379 (ref 7.25–7.43)
pO2, Ven: 28 mm[Hg] — CL (ref 32–45)
pO2, Ven: 30 mm[Hg] — CL (ref 32–45)

## 2023-02-14 LAB — URINE DRUG SCREEN, QUALITATIVE (ARMC ONLY)
Amphetamines, Ur Screen: NOT DETECTED
Barbiturates, Ur Screen: NOT DETECTED
Benzodiazepine, Ur Scrn: POSITIVE — AB
Cannabinoid 50 Ng, Ur ~~LOC~~: NOT DETECTED
Cocaine Metabolite,Ur ~~LOC~~: NOT DETECTED
MDMA (Ecstasy)Ur Screen: NOT DETECTED
Methadone Scn, Ur: NOT DETECTED
Opiate, Ur Screen: NOT DETECTED
Phencyclidine (PCP) Ur S: NOT DETECTED
Tricyclic, Ur Screen: NOT DETECTED

## 2023-02-14 LAB — HIV ANTIBODY (ROUTINE TESTING W REFLEX): HIV Screen 4th Generation wRfx: NONREACTIVE

## 2023-02-14 LAB — MAGNESIUM: Magnesium: 2.2 mg/dL (ref 1.7–2.4)

## 2023-02-14 LAB — BASIC METABOLIC PANEL
Anion gap: 8 (ref 5–15)
BUN: 14 mg/dL (ref 6–20)
CO2: 23 mmol/L (ref 22–32)
Calcium: 9.1 mg/dL (ref 8.9–10.3)
Chloride: 109 mmol/L (ref 98–111)
Creatinine, Ser: 1.08 mg/dL (ref 0.61–1.24)
GFR, Estimated: >60 mL/min (ref >60)
Glucose, Bld: 98 mg/dL (ref 70–99)
Potassium: 3.6 mmol/L (ref 3.5–5.1)
Sodium: 140 mmol/L (ref 135–145)

## 2023-02-14 LAB — TSH: TSH: 1.908 u[IU]/mL (ref 0.350–4.500)

## 2023-02-14 LAB — FERRITIN: Ferritin: 54 ng/mL (ref 24–336)

## 2023-02-14 SURGERY — RIGHT AND LEFT HEART CATH

## 2023-02-14 MED ORDER — VERAPAMIL HCL 2.5 MG/ML IV SOLN: INTRAVENOUS | Status: DC | PRN

## 2023-02-14 MED ORDER — HEPARIN (PORCINE) IN NACL 2000-0.9 UNIT/L-% IV SOLN
INTRAVENOUS | Status: DC | PRN
  Administered 2023-02-14: 1000 mL

## 2023-02-14 MED ORDER — MIDAZOLAM HCL 2 MG/2ML IJ SOLN
INTRAMUSCULAR | Status: DC | PRN
  Administered 2023-02-14: 2 mg via INTRAVENOUS

## 2023-02-14 MED ORDER — FUROSEMIDE 10 MG/ML IJ SOLN
20.0000 mg | Freq: Once | INTRAMUSCULAR | Status: AC
  Administered 2023-02-14: 20 mg via INTRAVENOUS
  Filled 2023-02-14: qty 2

## 2023-02-14 MED ORDER — ASPIRIN 81 MG PO CHEW
81.0000 mg | CHEWABLE_TABLET | ORAL | Status: AC
  Administered 2023-02-14: 81 mg via ORAL

## 2023-02-14 MED ORDER — SODIUM CHLORIDE 0.9 % IV SOLN: INTRAVENOUS | Status: DC

## 2023-02-14 MED ORDER — SODIUM CHLORIDE 0.9% FLUSH: 3.0000 mL | INTRAVENOUS | Status: DC | PRN

## 2023-02-14 MED ORDER — HEPARIN (PORCINE) IN NACL 1000-0.9 UT/500ML-% IV SOLN
INTRAVENOUS | Status: AC
Start: 2023-02-14 — End: ?
  Filled 2023-02-14: qty 1000

## 2023-02-14 MED ORDER — ORAL CARE MOUTH RINSE: 15.0000 mL | OROMUCOSAL | Status: DC | PRN

## 2023-02-14 MED ORDER — SODIUM CHLORIDE 0.9% FLUSH
3.0000 mL | Freq: Two times a day (BID) | INTRAVENOUS | Status: DC
  Administered 2023-02-14: 3 mL via INTRAVENOUS

## 2023-02-14 MED ORDER — POTASSIUM CHLORIDE CRYS ER 20 MEQ PO TBCR
40.0000 meq | EXTENDED_RELEASE_TABLET | ORAL | Status: AC
  Administered 2023-02-14 (×2): 40 meq via ORAL
  Filled 2023-02-14 (×2): qty 2

## 2023-02-14 MED ORDER — LIDOCAINE HCL (PF) 1 % IJ SOLN
INTRAMUSCULAR | Status: DC | PRN
  Administered 2023-02-14: 5 mL

## 2023-02-14 MED ORDER — IOHEXOL 300 MG/ML  SOLN
INTRAMUSCULAR | Status: DC | PRN
  Administered 2023-02-14: 48 mL

## 2023-02-14 MED ORDER — SPIRONOLACTONE 25 MG PO TABS
25.0000 mg | ORAL_TABLET | Freq: Every day | ORAL | Status: DC
  Administered 2023-02-15: 25 mg via ORAL
  Filled 2023-02-14: qty 1

## 2023-02-14 MED ORDER — LIDOCAINE HCL 1 % IJ SOLN
INTRAMUSCULAR | Status: AC
  Filled 2023-02-14: qty 20

## 2023-02-14 MED ORDER — MIDAZOLAM HCL 2 MG/2ML IJ SOLN
INTRAMUSCULAR | Status: AC
  Filled 2023-02-14: qty 2

## 2023-02-14 MED ORDER — VERAPAMIL HCL 2.5 MG/ML IV SOLN
INTRAVENOUS | Status: AC
Start: 2023-02-14 — End: ?
  Filled 2023-02-14: qty 2

## 2023-02-14 MED ORDER — FENTANYL CITRATE (PF) 100 MCG/2ML IJ SOLN
INTRAMUSCULAR | Status: AC
  Filled 2023-02-14: qty 2

## 2023-02-14 MED ORDER — SACUBITRIL-VALSARTAN 49-51 MG PO TABS: 1.0000 | ORAL_TABLET | Freq: Two times a day (BID) | ORAL | Status: DC

## 2023-02-14 MED ORDER — DAPAGLIFLOZIN PROPANEDIOL 10 MG PO TABS
10.0000 mg | ORAL_TABLET | Freq: Every day | ORAL | Status: DC
Start: 2023-02-14 — End: 2023-02-15
  Administered 2023-02-14 – 2023-02-15 (×2): 10 mg via ORAL
  Filled 2023-02-14 (×2): qty 1

## 2023-02-14 MED ORDER — ASPIRIN 81 MG PO CHEW
CHEWABLE_TABLET | ORAL | Status: AC
  Filled 2023-02-14: qty 1

## 2023-02-14 MED ORDER — HEPARIN (PORCINE) IN NACL 1000-0.9 UT/500ML-% IV SOLN
INTRAVENOUS | Status: AC
  Filled 2023-02-14: qty 1000

## 2023-02-14 MED ORDER — ONDANSETRON HCL 4 MG/2ML IJ SOLN
4.0000 mg | Freq: Four times a day (QID) | INTRAMUSCULAR | Status: DC | PRN
Start: 2023-02-14 — End: 2023-02-15

## 2023-02-14 MED ORDER — HEPARIN SODIUM (PORCINE) 1000 UNIT/ML IJ SOLN
INTRAMUSCULAR | Status: AC
Start: 2023-02-14 — End: ?
  Filled 2023-02-14: qty 10

## 2023-02-14 MED ORDER — SACUBITRIL-VALSARTAN 24-26 MG PO TABS
1.0000 | ORAL_TABLET | Freq: Two times a day (BID) | ORAL | Status: DC
  Administered 2023-02-15: 1 via ORAL
  Filled 2023-02-14 (×2): qty 1

## 2023-02-14 MED ORDER — GADOBUTROL 1 MMOL/ML IV SOLN
14.0000 mL | Freq: Once | INTRAVENOUS | Status: AC | PRN
  Administered 2023-02-14: 14 mL via INTRAVENOUS

## 2023-02-14 MED ORDER — SODIUM CHLORIDE 0.9 % IV SOLN: 250.0000 mL | INTRAVENOUS | Status: DC | PRN

## 2023-02-14 MED ORDER — VERAPAMIL HCL 2.5 MG/ML IV SOLN
INTRAVENOUS | Status: AC
  Filled 2023-02-14: qty 2

## 2023-02-14 MED ORDER — HEPARIN SODIUM (PORCINE) 1000 UNIT/ML IJ SOLN
INTRAMUSCULAR | Status: DC | PRN
  Administered 2023-02-14: 5000 [IU] via INTRAVENOUS

## 2023-02-14 MED ORDER — ACETAMINOPHEN 325 MG PO TABS: 650.0000 mg | ORAL_TABLET | ORAL | Status: DC | PRN

## 2023-02-14 MED ORDER — SODIUM CHLORIDE 0.9 % IV BOLUS
INTRAVENOUS | Status: AC | PRN
  Administered 2023-02-14: 250 mL via INTRAVENOUS

## 2023-02-14 MED ORDER — FENTANYL CITRATE (PF) 100 MCG/2ML IJ SOLN
INTRAMUSCULAR | Status: DC | PRN
  Administered 2023-02-14: 50 ug via INTRAVENOUS

## 2023-02-14 SURGICAL SUPPLY — 12 items
CATH 5FR JL3.5 JR4 ANG PIG MP (CATHETERS) IMPLANT
CATH SWAN GANZ 7F STRAIGHT (CATHETERS) IMPLANT
DEVICE RAD TR BAND REGULAR (VASCULAR PRODUCTS) IMPLANT
DRAPE BRACHIAL (DRAPES) IMPLANT
GLIDESHEATH SLEND SS 6F .021 (SHEATH) IMPLANT
GLIDESHEATH SLENDER 7FR .021G (SHEATH) IMPLANT
GUIDEWIRE INQWIRE 1.5J.035X260 (WIRE) IMPLANT
INQWIRE 1.5J .035X260CM (WIRE) ×1
PACK CARDIAC CATH (CUSTOM PROCEDURE TRAY) IMPLANT
PROTECTION STATION PRESSURIZED (MISCELLANEOUS) ×1
SET ATX-X65L (MISCELLANEOUS) IMPLANT
STATION PROTECTION PRESSURIZED (MISCELLANEOUS) IMPLANT
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
## 2023-02-14 NOTE — Telephone Encounter (Signed)
Patient Product/process development scientist completed.    The patient is insured through [employer]. Patient has [employer], may use a copay card, and/or apply for patient assistance if available.    Ran test claim for Entresto 24-26 mg and the current 30 day co-pay is $281.75 due to a $150.00 deductible.  Ran test claim for Farxiga 10 mg and the current 30 day co-pay is $261.51 due to a $150.00 deductible.  Ran test claim for Jardiance 10 mg and the current 30 day co-pay is $267.03 due to a $150.00 deductible.  This test claim was processed through [facility]- copay amounts may vary at other pharmacies due to pharmacy/plan contracts, or as the patient moves through the different stages of their insurance plan.     Roland Earl, CPHT Pharmacy Technician III Certified Patient Advocate [facility] Patient Advocate Team Direct Number: [phone]  Fax: [phone]

## 2023-02-14 NOTE — Progress Notes (Signed)
Dr. Gasper Lloyd in at bedside, speaking extensively with pt. And his wife re: cath results. Both verbalized understanding of conversation with MD.

## 2023-02-14 NOTE — Progress Notes (Signed)
PROGRESS NOTE    George Richards  WRU:[id number] DOB: 10/30/1980 DOA: 02/11/2023 PCP: Pcp, No     Brief Narrative:  George Richards is a 42 year old male with history of obesity, hypertension, who presents emergency department for chief concerns of shortness of breath.  He reported feeling short of breath for 1.5 to 2 weeks, worse at night laying down.  He has been sitting up to sleep over the past 2 weeks.  Also admitted to swelling in his legs.  On presentation, his BNP was elevated at 877.8.  Patient was admitted for suspected CHF exacerbation.  He was started on IV Lasix. Echocardiogram showed EF 20% with grade 2 dd.  Cardiology consulted.  New events last 24 hours / Subjective: Patient seen walking in the hallway.  He denies any shortness of breath.  Assessment & Plan:   Principal Problem:   Acute combined systolic and diastolic congestive heart failure (HCC) Active Problems:   Hypertension   Elevated troponin   Anxiety   Dyspnea on exertion   Morbid obesity (HCC)   Acute combined systolic and diastolic CHF Dyspnea on exertion -Echocardiogram showed EF 20% with grade 2 dd -Continue IV Lasix, Farxiga, Entresto, spironolactone -Strict I's and O's, daily weight, fluid restriction diet -Cardiology and advanced heart failure team consulted  -Planning heart cath -Cardiac MRI  Demand ischemia -Secondary to above, troponin 28, 30  Hypertension -Norvasc discontinued  Anxiety -Klonopin prn   Obesity -BMI 35.93  Hypokalemia -Replace  DVT prophylaxis:  heparin injection 5,000 Units Start: 02/11/23 1430 Place TED hose Start: 02/11/23 1427  Code Status: Full Family Communication: None Disposition Plan: Home Status is: Inpatient Remains inpatient appropriate because: Heart cath today  Antimicrobials:  Anti-infectives (From admission, onward)    None        Objective: Vitals:   02/13/23 2008 02/13/23 2314 02/14/23 0517 02/14/23 0704  BP: (!) 130/101 107/81 122/86    Pulse: 97 86 81   Resp: 18 19 17    Temp: 98 F (36.7 C)     TempSrc:      SpO2: 98% 97% 97%   Weight:    113.6 kg  Height:        Intake/Output Summary (Last 24 hours) at 02/14/2023 1104 Last data filed at 02/13/2023 2042 Gross per 24 hour  Intake 237 ml  Output --  Net 237 ml   Filed Weights   02/11/23 1032 02/14/23 0704  Weight: 113.4 kg 113.6 kg    Examination:  General exam: Appears calm and comfortable  Respiratory system:  Respiratory effort normal. No respiratory distress. No conversational dyspnea. On room air  Central nervous system: Alert and oriented. No focal neurological deficits. Speech clear.  Extremities: Symmetric in appearance  Psychiatry: Judgement and insight appear normal. Mood & affect appropriate.   Data Reviewed: I have personally reviewed following labs and imaging studies  CBC: Recent Labs  Lab 02/11/23 1034 02/12/23 0456  WBC 8.1 6.7  HGB 14.3 13.5  HCT 43.5 40.9  MCV 83.2 82.5  PLT 255 232   Basic Metabolic Panel: Recent Labs  Lab 02/11/23 1034 02/12/23 0456 02/13/23 0507 02/14/23 0514  NA 138 138 139 140  K 3.9 3.3* 3.1* 3.6  CL 106 104 106 109  CO2 25 28 24 23   GLUCOSE 111* 91 111* 98  BUN 21* 22* 21* 14  CREATININE 1.10 0.99 1.05 1.08  CALCIUM 9.0 8.6* 8.5* 9.1  MG  --   --   --  2.2  GFR: Estimated Creatinine Clearance: 110.1 mL/min (by C-G formula based on SCr of 1.08 mg/dL). Liver Function Tests: Recent Labs  Lab 02/11/23 1034  AST 27  ALT 31  ALKPHOS 53  BILITOT 1.0  PROT 7.1  ALBUMIN 4.2   Recent Labs  Lab 02/11/23 1034  LIPASE 30   No results for input(s): "AMMONIA" in the last 168 hours. Coagulation Profile: No results for input(s): "INR", "PROTIME" in the last 168 hours. Cardiac Enzymes: No results for input(s): "CKTOTAL", "CKMB", "CKMBINDEX", "TROPONINI" in the last 168 hours. BNP (last 3 results) No results for input(s): "PROBNP" in the last 8760 hours. HbA1C: No results for input(s):  "HGBA1C" in the last 72 hours. CBG: No results for input(s): "GLUCAP" in the last 168 hours. Lipid Profile: No results for input(s): "CHOL", "HDL", "LDLCALC", "TRIG", "CHOLHDL", "LDLDIRECT" in the last 72 hours. Thyroid Function Tests: Recent Labs    02/14/23 0514  TSH 1.908   Anemia Panel: Recent Labs    02/14/23 0514  FERRITIN 54   Sepsis Labs: No results for input(s): "PROCALCITON", "LATICACIDVEN" in the last 168 hours.  No results found for this or any previous visit (from the past 240 hours).    Radiology Studies: ECHOCARDIOGRAM COMPLETE Result Date: 02/13/2023    ECHOCARDIOGRAM REPORT   Patient Name:   George Richards Date of Exam: 02/13/2023 Medical Rec #:  [id number]    Height:       70.0 in Accession #:    [id number]   Weight:       250.0 lb Date of Birth:  05-04-1980    BSA:          2.295 m Patient Age:    42 years     BP:           135/105 mmHg Patient Gender: M            HR:           89 bpm. Exam Location:  [facility] Procedure: 2D Echo, Cardiac Doppler and Color Doppler Indications:     Dyspnea R06.00                  Elevated troponin  History:         Patient has no prior history of Echocardiogram examinations.                  Risk Factors:Hypertension. Anxiety.  Sonographer:     Cristela Blue Referring Phys:  [id number] Kama Cammarano Diagnosing Phys: Yvonne Kendall MD IMPRESSIONS  1. Left ventricular ejection fraction, by estimation, is 20 to 25%. The left ventricle has severely decreased function. The left ventricle demonstrates global hypokinesis. The left ventricular internal cavity size was severely dilated. Left ventricular diastolic parameters are consistent with Grade II diastolic dysfunction (pseudonormalization). Elevated left atrial pressure.  2. Right ventricular systolic function is mildly reduced. The right ventricular size is mildly enlarged. Tricuspid regurgitation signal is inadequate for assessing PA pressure.  3. Left atrial size was moderately dilated.  4. Right  atrial size was mildly dilated.  5. The mitral valve is normal in structure. Mild to moderate mitral valve regurgitation. No evidence of mitral stenosis.  6. The aortic valve is tricuspid. Aortic valve regurgitation is not visualized. No aortic stenosis is present.  7. The inferior vena cava is normal in size with <50% respiratory variability, suggesting right atrial pressure of 8 mmHg. FINDINGS  Left Ventricle: Left ventricular ejection fraction, by estimation, is 20 to 25%. The left  ventricle has severely decreased function. The left ventricle demonstrates global hypokinesis. The left ventricular internal cavity size was severely dilated. There is no left ventricular hypertrophy. Left ventricular diastolic parameters are consistent with Grade II diastolic dysfunction (pseudonormalization). Elevated left atrial pressure. Right Ventricle: The right ventricular size is mildly enlarged. No increase in right ventricular wall thickness. Right ventricular systolic function is mildly reduced. Tricuspid regurgitation signal is inadequate for assessing PA pressure. Left Atrium: Left atrial size was moderately dilated. Right Atrium: Right atrial size was mildly dilated. Pericardium: There is no evidence of pericardial effusion. Mitral Valve: The mitral valve is normal in structure. Mild to moderate mitral valve regurgitation. No evidence of mitral valve stenosis. MV peak gradient, 6.2 mmHg. The mean mitral valve gradient is 4.0 mmHg. Tricuspid Valve: The tricuspid valve is normal in structure. Tricuspid valve regurgitation is trivial. Aortic Valve: The aortic valve is tricuspid. Aortic valve regurgitation is not visualized. No aortic stenosis is present. Aortic valve mean gradient measures 2.0 mmHg. Aortic valve peak gradient measures 3.1 mmHg. Aortic valve area, by VTI measures 2.56 cm. Pulmonic Valve: The pulmonic valve was not well visualized. Pulmonic valve regurgitation is not visualized. No evidence of pulmonic  stenosis. Aorta: The aortic root is normal in size and structure. Pulmonary Artery: The pulmonary artery is not well seen. Venous: The inferior vena cava is normal in size with less than 50% respiratory variability, suggesting right atrial pressure of 8 mmHg. IAS/Shunts: No atrial level shunt detected by color flow Doppler.  LEFT VENTRICLE PLAX 2D LVIDd:         7.70 cm      Diastology LVIDs:         7.30 cm      LV e' medial:    3.48 cm/s LV PW:         1.00 cm      LV E/e' medial:  30.2 LV IVS:        0.93 cm      LV e' lateral:   5.44 cm/s LVOT diam:     2.20 cm      LV E/e' lateral: 19.3 LV SV:         30 LV SV Index:   13 LVOT Area:     3.80 cm  LV Volumes (MOD) LV vol d, MOD A2C: 429.0 ml LV vol d, MOD A4C: 393.0 ml LV vol s, MOD A2C: 360.0 ml LV vol s, MOD A4C: 286.0 ml LV SV MOD A2C:     69.0 ml LV SV MOD A4C:     393.0 ml LV SV MOD BP:      68.2 ml RIGHT VENTRICLE RV Basal diam:  4.40 cm RV Mid diam:    4.20 cm RV S prime:     12.50 cm/s TAPSE (M-mode): 1.8 cm LEFT ATRIUM            Index        RIGHT ATRIUM           Index LA diam:      4.60 cm  2.00 cm/m   RA Area:     22.50 cm LA Vol (A2C): 64.3 ml  28.02 ml/m  RA Volume:   75.10 ml  32.72 ml/m LA Vol (A4C): 113.0 ml 49.24 ml/m  AORTIC VALVE AV Area (Vmax):    2.31 cm AV Area (Vmean):   1.90 cm AV Area (VTI):     2.56 cm AV Vmax:  88.20 cm/s AV Vmean:          64.600 cm/s AV VTI:            0.116 m AV Peak Grad:      3.1 mmHg AV Mean Grad:      2.0 mmHg LVOT Vmax:         53.50 cm/s LVOT Vmean:        32.300 cm/s LVOT VTI:          0.078 m LVOT/AV VTI ratio: 0.67  AORTA Ao Root diam: 3.20 cm MITRAL VALVE MV Area (PHT): 5.66 cm     SHUNTS MV Area VTI:   1.25 cm     Systemic VTI:  0.08 m MV Peak grad:  6.2 mmHg     Systemic Diam: 2.20 cm MV Mean grad:  4.0 mmHg MV Vmax:       1.24 m/s MV Vmean:      90.2 cm/s MV Decel Time: 134 msec MV E velocity: 105.00 cm/s MV A velocity: 60.80 cm/s MV E/A ratio:  1.73 Cristal Deer End MD  Electronically signed by Yvonne Kendall MD Signature Date/Time: 02/13/2023/12:25:29 PM    Final       Scheduled Meds:  dapagliflozin propanediol  10 mg Oral Daily   furosemide  20 mg Intravenous Once   heparin  5,000 Units Subcutaneous Q8H   oxymetazoline  1 spray Each Nare BID   potassium chloride  40 mEq Oral Q4H   sacubitril-valsartan  1 tablet Oral BID   [START ON 02/15/2023] spironolactone  25 mg Oral Daily   Continuous Infusions:   LOS: 3 days   Time spent: 35 minutes   Noralee Stain, DO [facility] 02/14/2023, 11:04 AM   Available via Epic secure chat 7am-7pm After these hours, please refer to coverage provider listed on [email or web address]

## 2023-02-14 NOTE — TOC Progression Note (Signed)
Transition of Care [facility]) - Progression Note    Patient Details  Name: George Richards MRN: [id number] Date of Birth: August 31, 1980  [facility] CM/SW Contact  Truddie Hidden, RN Phone Number: 02/14/2023, 3:59 PM  Clinical Narrative:    TOC continuing to follow patient's progress throughout discharge planning.        Expected Discharge Plan and Services                                               Social Determinants of Health (SDOH) Interventions SDOH Screenings   Food Insecurity: No Food Insecurity (02/13/2023)  Housing: Unknown (02/13/2023)  Transportation Needs: No Transportation Needs (02/13/2023)  Utilities: Not At Risk (02/13/2023)  Tobacco Use: High Risk (02/11/2023)    Readmission Risk Interventions     No data to display
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
## 2023-02-14 NOTE — Interval H&P Note (Signed)
History and Physical Interval Note:  02/14/2023 12:59 PM  George Richards  has presented today for surgery, with the diagnosis of Heart Failure.  The various methods of treatment have been discussed with the patient and family. After consideration of risks, benefits and other options for treatment, the patient has consented to  Procedure(s): RIGHT AND LEFT HEART CATH (N/A) as a surgical intervention.  The patient's history has been reviewed, patient examined, no change in status, stable for surgery.  I have reviewed the patient's chart and labs.  Questions were answered to the patient's satisfaction.     Sarina Robleto

## 2023-02-15 ENCOUNTER — Encounter: Payer: Self-pay | Admitting: Cardiology

## 2023-02-15 DIAGNOSIS — I5041 Acute combined systolic (congestive) and diastolic (congestive) heart failure: Secondary | ICD-10-CM | POA: Diagnosis not present

## 2023-02-15 DIAGNOSIS — I10 Essential (primary) hypertension: Secondary | ICD-10-CM | POA: Diagnosis not present

## 2023-02-15 LAB — BASIC METABOLIC PANEL
Anion gap: 10 (ref 5–15)
BUN: 24 mg/dL — ABNORMAL HIGH (ref 6–20)
CO2: 23 mmol/L (ref 22–32)
Calcium: 9 mg/dL (ref 8.9–10.3)
Chloride: 105 mmol/L (ref 98–111)
Creatinine, Ser: 1.7 mg/dL — ABNORMAL HIGH (ref 0.61–1.24)
GFR, Estimated: 50 mL/min — ABNORMAL LOW (ref >60)
Glucose, Bld: 100 mg/dL — ABNORMAL HIGH (ref 70–99)
Potassium: 4 mmol/L (ref 3.5–5.1)
Sodium: 138 mmol/L (ref 135–145)

## 2023-02-15 LAB — MAGNESIUM: Magnesium: 2.3 mg/dL (ref 1.7–2.4)

## 2023-02-15 LAB — ANA W/REFLEX: Anti Nuclear Antibody (ANA): NEGATIVE

## 2023-02-15 MED ORDER — SODIUM CHLORIDE 0.9 % IV BOLUS
125.0000 mL | Freq: Once | INTRAVENOUS | Status: AC
  Administered 2023-02-15: 125 mL via INTRAVENOUS

## 2023-02-15 MED ORDER — SPIRONOLACTONE 25 MG PO TABS: 25.0000 mg | ORAL_TABLET | Freq: Every day | ORAL | 0 refills | Status: DC

## 2023-02-15 MED ORDER — SACUBITRIL-VALSARTAN 24-26 MG PO TABS: 1.0000 | ORAL_TABLET | Freq: Two times a day (BID) | ORAL | 0 refills | Status: DC

## 2023-02-15 MED ORDER — FUROSEMIDE 20 MG PO TABS: 20.0000 mg | ORAL_TABLET | Freq: Every day | ORAL | 0 refills | Status: DC | PRN

## 2023-02-15 MED ORDER — DAPAGLIFLOZIN PROPANEDIOL 10 MG PO TABS: 10.0000 mg | ORAL_TABLET | Freq: Every day | ORAL | 0 refills | Status: DC

## 2023-02-15 NOTE — Progress Notes (Signed)
Education Assessment and Provision:  Detailed education and instructions provided on heart failure disease management including the following:  Signs and symptoms of Heart Failure When to call the physician-Office number provided to patient. Importance of daily weights-Wife is ordering a scale for home. Low sodium diet-currently eats a high sodium diet and fast food for convenience with work.  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 Especially in the warmer weather. Medication management-Wife is ordering a AM/PM pill box for medication organization. Anticipated future follow-up appointments-02/27/23 @ 2:30 PM with Inetta Fermo ( Pt is aware of appointment date, time and location).  He would like his future appointments to be scheduled with Dr. Gasper Lloyd.  Patient education given on each of the above topics.  Patient acknowledges understanding via teach back method and acceptance of all instructions.  Education Materials:  "Living Better With Heart Failure" Booklet, HF zone tool, & Daily Weight Tracker Tool.  Patient has scale at home: Wife is ordering one Patient has pill box at home: Wife is ordering one.  Roxy Horseman, RN, BSN [facility] Heart Failure Navigator Secure Chat Only

## 2023-02-15 NOTE — Discharge Summary (Signed)
Physician Discharge Summary  George Richards [name]:[id number] DOB: 1980-03-23 DOA: 02/11/2023  PCP: Oneita Hurt, No  Admit date: 02/11/2023 Discharge date: 02/15/2023  Admitted From: Home Disposition: Home  Recommendations for Outpatient Follow-up:  Follow up with PCP in 1-2 weeks Please obtain BMP/CBC in 3-5 days Follow-up outpatient cardiologist Discontinued Norvasc.  Currently on Entresto, Aldactone and Farxiga  Discharge Condition: Stable CODE STATUS: Full code Diet recommendation: Heart healthy  Brief/Interim Summary: Brief Narrative:  42 year old male with history of obesity, hypertension, who presents emergency department for chief concerns of shortness of breath.  He reported feeling short of breath for 1.5 to 2 weeks, worse at night laying down.  He has been sitting up to sleep over the past 2 weeks.  Also admitted to swelling in his legs.  On presentation, his BNP was elevated at 877.8.  Patient was admitted for suspected CHF exacerbation.  He was started on IV Lasix. Echocardiogram showed EF 20% with grade 2 dd.  CHF team was consulted, underwent cardiac MRI which showed reduced EF otherwise consistent with nonischemic disease.  LHC/RHC showed reduced cardiac output and cardiac index otherwise no real evidence of CAD. Renal function slightly elevated next day, discussed with cardiology who follow him up outpatient can further adjust medicines as needed.  Medically cleared for discharge.  All the instructions have been communicated by me to the patient and his wife who was present at bedside  Assessment & Plan:  Principal Problem:   Acute combined systolic and diastolic congestive heart failure (HCC) Active Problems:   Hypertension   Elevated troponin   Anxiety   Dyspnea on exertion   Morbid obesity (HCC)     Acute combined systolic and diastolic CHF Dyspnea on exertion Echo shows reduced EF of 20% with grade 2 DD.  Seen by CHF team and underwent cardiac MRI which showed reduced  EF otherwise consistent with nonischemic disease.  LHC/RHC showed reduced cardiac output and cardiac index otherwise no real evidence of CAD.  At this time planning GDMT per cardiology recommendations.  Recommending for now to discontinue Norvasc, continue Aldactone, Entresto and Farxiga  Acute kidney injury -Baseline creatinine 1.0, today 1.7.  Discussed with cardiology, received small fluid bolus.  Okay for discharge with outpatient follow-up and labs early next week.   Hypertension Medications as mentioned above.   Anxiety -Klonopin prn    Obesity -BMI 35.93   Hypokalemia -Replace   DVT prophylaxis: SQ Hep  Code Status: Full Family Communication: None Disposition Plan: Home Status is: Inpatient Remains inpatient appropriate because: Discharge today  Subjective: Patient ambulating the hallway without any concerns.  Wanting to go home.   Examination:  General exam: Appears calm and comfortable  Respiratory system: Clear to auscultation. Respiratory effort normal. Cardiovascular system: S1 & S2 heard, RRR. No JVD, murmurs, rubs, gallops or clicks. No pedal edema. Gastrointestinal system: Abdomen is nondistended, soft and nontender. No organomegaly or masses felt. Normal bowel sounds heard. Central nervous system: Alert and oriented. No focal neurological deficits. Extremities: Symmetric 5 x 5 power. Skin: No rashes, lesions or ulcers Psychiatry: Judgement and insight appear normal. Mood & affect appropriate.    Discharge Diagnoses:  Principal Problem:   Acute combined systolic and diastolic congestive heart failure (HCC) Active Problems:   Hypertension   Elevated troponin   Anxiety   Dyspnea on exertion   Morbid obesity (HCC)      Discharge Exam: Vitals:   02/15/23 0430 02/15/23 0947  BP: 96/70 118/79  Pulse: 78 84  Resp:  18  Temp:  97.9 F (36.6 C)  SpO2:  99%   Vitals:   02/15/23 0428 02/15/23 0430 02/15/23 0500 02/15/23 0947  BP: (!) 86/69 96/70   118/79  Pulse: 80 78  84  Resp: 16   18  Temp: 98.1 F (36.7 C)   97.9 F (36.6 C)  TempSrc: Oral     SpO2: 95%   99%  Weight:   115.5 kg   Height:        Discharge Instructions  Discharge Instructions     AMB Referral to Cardiac Rehabilitation - Phase II   Complete by: As directed    Diagnosis: Heart Failure (see criteria below if ordering Phase II)   Heart Failure Type: Chronic Systolic   After initial evaluation and assessments completed: Virtual Based Care may be provided alone or in conjunction with Phase 2 Cardiac Rehab based on patient barriers.: Yes   Intensive Cardiac Rehabilitation (ICR) [facility] location only OR Traditional Cardiac Rehabilitation (TCR) *If criteria for ICR are not met will enroll in TCR [facility] only): Yes      Allergies as of 02/15/2023   No Known Allergies      Medication List     STOP taking these medications    amLODipine 10 MG tablet Commonly known as: NORVASC   metoprolol succinate 25 MG 24 hr tablet Commonly known as: TOPROL-XL   predniSONE 10 MG tablet Commonly known as: DELTASONE       TAKE these medications    albuterol 108 (90 Base) MCG/ACT inhaler Commonly known as: VENTOLIN HFA Inhale 2 puffs into the lungs every 4 (four) hours as needed for wheezing or shortness of breath.   clonazePAM 1 MG tablet Commonly known as: KLONOPIN Take 1 tablet (1 mg total) by mouth 2 (two) times daily as needed for anxiety.   dapagliflozin propanediol 10 MG Tabs tablet Commonly known as: FARXIGA Take 1 tablet (10 mg total) by mouth daily. Start taking on: February 16, 2023   sacubitril-valsartan 24-26 MG Commonly known as: ENTRESTO Take 1 tablet by mouth 2 (two) times daily.   spironolactone 25 MG tablet Commonly known as: ALDACTONE Take 1 tablet (25 mg total) by mouth daily. Start taking on: February 16, 2023        Follow-up Information     [facility]. Go in 13 day(s).   Specialty:  Cardiology Why: Hospital Follow-Up 02/27/23 @ 2:30 pm Please bring all medications to appointment [facility], [address] Second Floor Free Valet Parking @ the door Contact information: [address] [place] [address] [phone]               No Known Allergies  You were cared for by a hospitalist during your hospital stay. If you have any questions about your discharge medications or the care you received while you were in the hospital after you are discharged, you can call the unit and asked to speak with the hospitalist on call if the hospitalist that took care of you is not available. Once you are discharged, your primary care physician will handle any further medical issues. Please note that no refills for any discharge medications will be authorized once you are discharged, as it is imperative that you return to your primary care physician (or establish a relationship with a primary care physician if you do not have one) for your aftercare needs so that they can reassess your need for  medications and monitor your lab values.  You were cared for by a hospitalist during your hospital stay. If you have any questions about your discharge medications or the care you received while you were in the hospital after you are discharged, you can call the unit and asked to speak with the hospitalist on call if the hospitalist that took care of you is not available. Once you are discharged, your primary care physician will handle any further medical issues. Please note that NO REFILLS for any discharge medications will be authorized once you are discharged, as it is imperative that you return to your primary care physician (or establish a relationship with a primary care physician if you do not have one) for your aftercare needs so that they can reassess your need for medications and monitor your lab values.  Please request your Prim.MD to go over all Hospital  Tests and Procedure/Radiological results at the follow up, please get all Hospital records sent to your Prim MD by signing hospital release before you go home.  Get CBC, CMP, 2 view Chest X ray checked  by Primary MD during your next visit or SNF MD in 5-7 days ( we routinely change or add medications that can affect your baseline labs and fluid status, therefore we recommend that you get the mentioned basic workup next visit with your PCP, your PCP may decide not to get them or add new tests based on their clinical decision)  On your next visit with your primary care physician please Get Medicines reviewed and adjusted.  If you experience worsening of your admission symptoms, develop shortness of breath, life threatening emergency, suicidal or homicidal thoughts you must seek medical attention immediately by calling 911 or calling your MD immediately  if symptoms less severe.  You Must read complete instructions/literature along with all the possible adverse reactions/side effects for all the Medicines you take and that have been prescribed to you. Take any new Medicines after you have completely understood and accpet all the possible adverse reactions/side effects.   Do not drive, operate heavy machinery, perform activities at heights, swimming or participation in water activities or provide baby sitting services if your were admitted for syncope or siezures until you have seen by Primary MD or a Neurologist and advised to do so again.  Do not drive when taking Pain medications.   Procedures/Studies: CARDIAC CATHETERIZATION Result Date: 02/14/2023 HEMODYNAMICS: RA:   3-5 mmHg (mean) RV:   26/3 mmHg PA:   31/13 mmHg (22 mean) PCWP:  11 mmHg (mean)    Estimated Fick CO/CI   3.8 L/min, 1.7 L/min/m2 Thermodilution CO/CI   3.9 L/min, 1.7 L/min/m2    TPG    11  mmHg     PVR     2.8 Wood Units PAPi      6  IMPRESSION: Low pre and post capillary filling pressures. Severely reduced cardiac output and  index Normal PVR Right dominant coronary circulation with no CAD consistent with nonischemic cardiomyopathy. Aditya Sabharwal Advanced Heart Failure 3:32 PM  MR CARDIAC MORPHOLOGY W WO CONTRAST Result Date: 02/14/2023 CLINICAL DATA:  Cardiomyopathy, evaluate EF EXAM: CARDIAC MRI TECHNIQUE: The patient was scanned on a 1.5 Tesla Siemens magnet. A dedicated cardiac coil was used. Functional imaging was done using Fiesta sequences. 2,3, and 4 chamber views were done to assess for RWMA's. Modified Simpson's rule using a short axis stack was used to calculate an ejection fraction on a dedicated work [employer], [employer]. The  patient received 14 cc of Gadavist. After 10 minutes inversion recovery sequences were used to assess for infiltration and scar tissue. Velocity flow mapping performed in the ascending aorta and main pulmonary artery. CONTRAST:  14 cc  of Gadavist FINDINGS: 1. Moderate left ventricular size, normal LV wall thickness, severely reduced systolic function (LVEF = 13%). There is global hypokinesis. There is no late gadolinium enhancement in the left ventricular myocardium. LVEDV: 487 ml LVESV: 426 ml SV: 61 ml CO: 4.2 L/min Myocardial mass: 239 g Native T1 value 992 ms. (Normal < 1000 ms) LV ECV 30%.  (Normal <30%). 2. Normal right ventricular size and thickness. Severely reduced RV systolic function. There is global hypokinesis 3.  Mild left atrial size, normal right atrial size. 4. Normal size of the aortic root, ascending aorta and pulmonary artery. 5. Mild mitral regurgitation. no significant valvular abnormalities. 6.  Normal pericardium.  No pericardial effusion. IMPRESSION: 1. Moderately dilated LV size, severely reduced LV systolic function. LVEF 13%. 2.  No LGE or scar. 3.  No evidence for myocardial infiltration. 4.  Severely reduced RV function. 5.  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 Electronically Signed   By: Debbe Odea M.D.   On: 02/14/2023  15:35   MR CARDIAC VELOCITY FLOW MAP Result Date: 02/14/2023 CLINICAL DATA:  Cardiomyopathy, evaluate EF EXAM: CARDIAC MRI TECHNIQUE: The patient was scanned on a 1.5 Tesla Siemens magnet. A dedicated cardiac coil was used. Functional imaging was done using Fiesta sequences. 2,3, and 4 chamber views were done to assess for RWMA's. Modified Simpson's rule using a short axis stack was used to calculate an ejection fraction on a dedicated work [employer] officer, [employer]. The patient received 14 cc of Gadavist. After 10 minutes inversion recovery sequences were used to assess for infiltration and scar tissue. Velocity flow mapping performed in the ascending aorta and main pulmonary artery. CONTRAST:  14 cc  of Gadavist FINDINGS: 1. Moderate left ventricular size, normal LV wall thickness, severely reduced systolic function (LVEF = 13%). There is global hypokinesis. There is no late gadolinium enhancement in the left ventricular myocardium. LVEDV: 487 ml LVESV: 426 ml SV: 61 ml CO: 4.2 L/min Myocardial mass: 239 g Native T1 value 992 ms. (Normal < 1000 ms) LV ECV 30%.  (Normal <30%). 2. Normal right ventricular size and thickness. Severely reduced RV systolic function. There is global hypokinesis 3.  Mild left atrial size, normal right atrial size. 4. Normal size of the aortic root, ascending aorta and pulmonary artery. 5. Mild mitral regurgitation. no significant valvular abnormalities. 6.  Normal pericardium.  No pericardial effusion. IMPRESSION: 1. Moderately dilated LV size, severely reduced LV systolic function. LVEF 13%. 2.  No LGE or scar. 3.  No evidence for myocardial infiltration. 4.  Severely reduced RV function. 5.  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 Electronically Signed   By: Debbe Odea M.D.   On: 02/14/2023 15:35   MR CARDIAC VELOCITY FLOW MAP Result Date: 02/14/2023 CLINICAL DATA:  Cardiomyopathy, evaluate EF EXAM: CARDIAC MRI TECHNIQUE: The patient was  scanned on a 1.5 Tesla Siemens magnet. A dedicated cardiac coil was used. Functional imaging was done using Fiesta sequences. 2,3, and 4 chamber views were done to assess for RWMA's. Modified Simpson's rule using a short axis stack was used to calculate an ejection fraction on a dedicated work [employer] officer, [employer]. The patient received 14 cc of Gadavist. After 10 minutes inversion recovery sequences were used to assess for infiltration  and scar tissue. Velocity flow mapping performed in the ascending aorta and main pulmonary artery. CONTRAST:  14 cc  of Gadavist FINDINGS: 1. Moderate left ventricular size, normal LV wall thickness, severely reduced systolic function (LVEF = 13%). There is global hypokinesis. There is no late gadolinium enhancement in the left ventricular myocardium. LVEDV: 487 ml LVESV: 426 ml SV: 61 ml CO: 4.2 L/min Myocardial mass: 239 g Native T1 value 992 ms. (Normal < 1000 ms) LV ECV 30%.  (Normal <30%). 2. Normal right ventricular size and thickness. Severely reduced RV systolic function. There is global hypokinesis 3.  Mild left atrial size, normal right atrial size. 4. Normal size of the aortic root, ascending aorta and pulmonary artery. 5. Mild mitral regurgitation. no significant valvular abnormalities. 6.  Normal pericardium.  No pericardial effusion. IMPRESSION: 1. Moderately dilated LV size, severely reduced LV systolic function. LVEF 13%. 2.  No LGE or scar. 3.  No evidence for myocardial infiltration. 4.  Severely reduced RV function. 5.  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 Electronically Signed   By: Debbe Odea M.D.   On: 02/14/2023 15:35   ECHOCARDIOGRAM COMPLETE Result Date: 02/13/2023    ECHOCARDIOGRAM REPORT   Patient Name:   INIGO COOPERSTEIN Date of Exam: 02/13/2023 Medical Rec #:  [id number]    Height:       70.0 in Accession #:    [id number]   Weight:       250.0 lb Date of Birth:  07/13/1980    BSA:          2.295 m Patient Age:    42  years     BP:           135/105 mmHg Patient Gender: M            HR:           89 bpm. Exam Location:  [facility] Procedure: 2D Echo, Cardiac Doppler and Color Doppler Indications:     Dyspnea R06.00                  Elevated troponin  History:         Patient has no prior history of Echocardiogram examinations.                  Risk Factors:Hypertension. Anxiety.  Sonographer:     Cristela Blue Referring Phys:  [id number] JENNIFER CHOI Diagnosing Phys: Yvonne Kendall MD IMPRESSIONS  1. Left ventricular ejection fraction, by estimation, is 20 to 25%. The left ventricle has severely decreased function. The left ventricle demonstrates global hypokinesis. The left ventricular internal cavity size was severely dilated. Left ventricular diastolic parameters are consistent with Grade II diastolic dysfunction (pseudonormalization). Elevated left atrial pressure.  2. Right ventricular systolic function is mildly reduced. The right ventricular size is mildly enlarged. Tricuspid regurgitation signal is inadequate for assessing PA pressure.  3. Left atrial size was moderately dilated.  4. Right atrial size was mildly dilated.  5. The mitral valve is normal in structure. Mild to moderate mitral valve regurgitation. No evidence of mitral stenosis.  6. The aortic valve is tricuspid. Aortic valve regurgitation is not visualized. No aortic stenosis is present.  7. The inferior vena cava is normal in size with <50% respiratory variability, suggesting right atrial pressure of 8 mmHg. FINDINGS  Left Ventricle: Left ventricular ejection fraction, by estimation, is 20 to 25%. The left ventricle has severely decreased function. The left ventricle demonstrates global hypokinesis. The left  ventricular internal cavity size was severely dilated. There is no left ventricular hypertrophy. Left ventricular diastolic parameters are consistent with Grade II diastolic dysfunction (pseudonormalization). Elevated left atrial pressure. Right Ventricle: The  right ventricular size is mildly enlarged. No increase in right ventricular wall thickness. Right ventricular systolic function is mildly reduced. Tricuspid regurgitation signal is inadequate for assessing PA pressure. Left Atrium: Left atrial size was moderately dilated. Right Atrium: Right atrial size was mildly dilated. Pericardium: There is no evidence of pericardial effusion. Mitral Valve: The mitral valve is normal in structure. Mild to moderate mitral valve regurgitation. No evidence of mitral valve stenosis. MV peak gradient, 6.2 mmHg. The mean mitral valve gradient is 4.0 mmHg. Tricuspid Valve: The tricuspid valve is normal in structure. Tricuspid valve regurgitation is trivial. Aortic Valve: The aortic valve is tricuspid. Aortic valve regurgitation is not visualized. No aortic stenosis is present. Aortic valve mean gradient measures 2.0 mmHg. Aortic valve peak gradient measures 3.1 mmHg. Aortic valve area, by VTI measures 2.56 cm. Pulmonic Valve: The pulmonic valve was not well visualized. Pulmonic valve regurgitation is not visualized. No evidence of pulmonic stenosis. Aorta: The aortic root is normal in size and structure. Pulmonary Artery: The pulmonary artery is not well seen. Venous: The inferior vena cava is normal in size with less than 50% respiratory variability, suggesting right atrial pressure of 8 mmHg. IAS/Shunts: No atrial level shunt detected by color flow Doppler.  LEFT VENTRICLE PLAX 2D LVIDd:         7.70 cm      Diastology LVIDs:         7.30 cm      LV e' medial:    3.48 cm/s LV PW:         1.00 cm      LV E/e' medial:  30.2 LV IVS:        0.93 cm      LV e' lateral:   5.44 cm/s LVOT diam:     2.20 cm      LV E/e' lateral: 19.3 LV SV:         30 LV SV Index:   13 LVOT Area:     3.80 cm  LV Volumes (MOD) LV vol d, MOD A2C: 429.0 ml LV vol d, MOD A4C: 393.0 ml LV vol s, MOD A2C: 360.0 ml LV vol s, MOD A4C: 286.0 ml LV SV MOD A2C:     69.0 ml LV SV MOD A4C:     393.0 ml LV SV MOD BP:       68.2 ml RIGHT VENTRICLE RV Basal diam:  4.40 cm RV Mid diam:    4.20 cm RV S prime:     12.50 cm/s TAPSE (M-mode): 1.8 cm LEFT ATRIUM            Index        RIGHT ATRIUM           Index LA diam:      4.60 cm  2.00 cm/m   RA Area:     22.50 cm LA Vol (A2C): 64.3 ml  28.02 ml/m  RA Volume:   75.10 ml  32.72 ml/m LA Vol (A4C): 113.0 ml 49.24 ml/m  AORTIC VALVE AV Area (Vmax):    2.31 cm AV Area (Vmean):   1.90 cm AV Area (VTI):     2.56 cm AV Vmax:           88.20 cm/s AV Vmean:  64.600 cm/s AV VTI:            0.116 m AV Peak Grad:      3.1 mmHg AV Mean Grad:      2.0 mmHg LVOT Vmax:         53.50 cm/s LVOT Vmean:        32.300 cm/s LVOT VTI:          0.078 m LVOT/AV VTI ratio: 0.67  AORTA Ao Root diam: 3.20 cm MITRAL VALVE MV Area (PHT): 5.66 cm     SHUNTS MV Area VTI:   1.25 cm     Systemic VTI:  0.08 m MV Peak grad:  6.2 mmHg     Systemic Diam: 2.20 cm MV Mean grad:  4.0 mmHg MV Vmax:       1.24 m/s MV Vmean:      90.2 cm/s MV Decel Time: 134 msec MV E velocity: 105.00 cm/s MV A velocity: 60.80 cm/s MV E/A ratio:  1.73 Yvonne Kendall MD Electronically signed by Yvonne Kendall MD Signature Date/Time: 02/13/2023/12:25:29 PM    Final    CT Angio Chest PE W and/or Wo Contrast Result Date: 02/11/2023 CLINICAL DATA:  Cough.  SOB. EXAM: CT ANGIOGRAPHY CHEST WITH CONTRAST TECHNIQUE: Multidetector CT imaging of the chest was performed using the standard protocol during bolus administration of intravenous contrast. Multiplanar CT image reconstructions and MIPs were obtained to evaluate the vascular anatomy. RADIATION DOSE REDUCTION: This exam was performed according to the departmental dose-optimization program which includes automated exposure control, adjustment of the mA and/or kV according to patient size and/or use of iterative reconstruction technique. CONTRAST:  75mL OMNIPAQUE IOHEXOL 350 MG/ML SOLN COMPARISON:  None Available. FINDINGS: Cardiovascular: Cardiomegaly. No pericardial effusion.  No pulmonary artery filling defects to suggest PE. No aneurysm. Mediastinum/Nodes: No enlarged mediastinal, hilar, or axillary lymph nodes. Thyroid gland, trachea, and esophagus demonstrate no significant findings. Lungs/Pleura: Diffuse ground-glass opacities consistent with pneumonitis or edema. No focal consolidation. No pneumothorax pleural effusion. Upper Abdomen: Small hiatal hernia. Musculoskeletal: No chest wall abnormality. No acute or significant osseous findings. Review of the MIP images confirms the above findings. IMPRESSION: 1. No evidence of PE. 2. Cardiomegaly. 3. Diffuse ground-glass opacities consistent with pneumonitis or edema. Electronically Signed   By: Layla Maw M.D.   On: 02/11/2023 13:32   DG Chest 2 View Result Date: 02/11/2023 CLINICAL DATA:  cp EXAM: CHEST - 2 VIEW COMPARISON:  05/10/2019. FINDINGS: Cardiac silhouette is prominent. There is pulmonary interstitial prominence with vascular congestion. No focal consolidation. No pneumothorax or pleural effusion identified. IMPRESSION: Findings suggest CHF. Electronically Signed   By: Layla Maw M.D.   On: 02/11/2023 11:12     The results of significant diagnostics from this hospitalization (including imaging, microbiology, ancillary and laboratory) are listed below for reference.     Microbiology: No results found for this or any previous visit (from the past 240 hours).   Labs: BNP (last 3 results) Recent Labs    02/11/23 1034  BNP 877.8*   Basic Metabolic Panel: Recent Labs  Lab 02/11/23 1034 02/12/23 0456 02/13/23 0507 02/14/23 0514 02/14/23 1459 02/14/23 1507 02/15/23 0644  NA 138 138 139 140 141  141 144 138  K 3.9 3.3* 3.1* 3.6 4.3  4.3 4.0 4.0  CL 106 104 106 109  --   --  105  CO2 25 28 24 23   --   --  23  GLUCOSE 111* 91 111* 98  --   --  100*  BUN 21* 22* 21* 14  --   --  24*  CREATININE 1.10 0.99 1.05 1.08  --   --  1.70*  CALCIUM 9.0 8.6* 8.5* 9.1  --   --  9.0  MG  --   --    --  2.2  --   --  2.3   Liver Function Tests: Recent Labs  Lab 02/11/23 1034  AST 27  ALT 31  ALKPHOS 53  BILITOT 1.0  PROT 7.1  ALBUMIN 4.2   Recent Labs  Lab 02/11/23 1034  LIPASE 30   No results for input(s): "AMMONIA" in the last 168 hours. CBC: Recent Labs  Lab 02/11/23 1034 02/12/23 0456 02/14/23 1459 02/14/23 1507  WBC 8.1 6.7  --   --   HGB 14.3 13.5 15.6  16.0 14.6  HCT 43.5 40.9 46.0  47.0 43.0  MCV 83.2 82.5  --   --   PLT 255 232  --   --    Cardiac Enzymes: No results for input(s): "CKTOTAL", "CKMB", "CKMBINDEX", "TROPONINI" in the last 168 hours. BNP: Invalid input(s): "POCBNP" CBG: No results for input(s): "GLUCAP" in the last 168 hours. D-Dimer No results for input(s): "DDIMER" in the last 72 hours. Hgb A1c No results for input(s): "HGBA1C" in the last 72 hours. Lipid Profile No results for input(s): "CHOL", "HDL", "LDLCALC", "TRIG", "CHOLHDL", "LDLDIRECT" in the last 72 hours. Thyroid function studies Recent Labs    02/14/23 0514  TSH 1.908   Anemia work up Recent Labs    02/14/23 0514  FERRITIN 54   Urinalysis    Component Value Date/Time   COLORURINE AMBER (A) 08/01/2018 0228   APPEARANCEUR CLEAR (A) 08/01/2018 0228   APPEARANCEUR Clear 10/14/2012 1140   LABSPEC 1.018 08/01/2018 0228   LABSPEC 1.016 10/14/2012 1140   PHURINE 7.0 08/01/2018 0228   GLUCOSEU 50 (A) 08/01/2018 0228   GLUCOSEU Negative 10/14/2012 1140   HGBUR SMALL (A) 08/01/2018 0228   BILIRUBINUR NEGATIVE 08/01/2018 0228   BILIRUBINUR Negative 10/14/2012 1140   KETONESUR 20 (A) 08/01/2018 0228   PROTEINUR 30 (A) 08/01/2018 0228   NITRITE NEGATIVE 08/01/2018 0228   LEUKOCYTESUR NEGATIVE 08/01/2018 0228   LEUKOCYTESUR Negative 10/14/2012 1140   Sepsis Labs Recent Labs  Lab 02/11/23 1034 02/12/23 0456  WBC 8.1 6.7   Microbiology No results found for this or any previous visit (from the past 240 hours).   Time coordinating discharge:  I have spent  35 minutes face to face with the patient and on the ward discussing the patients care, assessment, plan and disposition with other care givers. >50% of the time was devoted counseling the patient about the risks and benefits of treatment/Discharge disposition and coordinating care.   SIGNED:   Miguel Rota, MD  [facility] 02/15/2023, 1:06 PM   If 7PM-7AM, please contact night-coverage

## 2023-02-15 NOTE — TOC Progression Note (Signed)
Transition of Care [facility]) - Progression Note    Patient Details  Name: George Richards MRN: [id number] Date of Birth: May 08, 1980  Transition of Care [facility]) CM/SW Contact  Truddie Hidden, RN Phone Number: 02/15/2023, 10:25 AM  Clinical Narrative:    TOC continuing to follow patient's progress throughout discharge planning.        Expected Discharge Plan and Services                                               Social Determinants of Health (SDOH) Interventions SDOH Screenings   Food Insecurity: No Food Insecurity (02/13/2023)  Housing: Unknown (02/13/2023)  Transportation Needs: No Transportation Needs (02/13/2023)  Utilities: Not At Risk (02/13/2023)  Tobacco Use: High Risk (02/11/2023)    Readmission Risk Interventions     No data to display

## 2023-02-15 NOTE — Progress Notes (Signed)
Advanced Heart Failure Rounding Note  Cardiologist: None   Subjective:   Chief Complaint: Acute HFrEF    Denies SOB. Denies orthopnea.    Objective:   Weight Range: 115.5 kg Body mass index is 36.54 kg/m.   Vital Signs:   Temp:  [97.7 F (36.5 C)-98.1 F (36.7 C)] 97.9 F (36.6 C) (12/19 0947) Pulse Rate:  [65-95] 84 (12/19 0947) Resp:  [14-27] 18 (12/19 0947) BP: (84-120)/(55-100) 118/79 (12/19 0947) SpO2:  [91 %-99 %] 99 % (12/19 0947) Weight:  [113.6 kg-115.5 kg] 115.5 kg (12/19 0500)    Weight change: Filed Weights   02/14/23 0704 02/14/23 1318 02/15/23 0500  Weight: 113.6 kg 113.6 kg 115.5 kg    Intake/Output:   Intake/Output Summary (Last 24 hours) at 02/15/2023 1034 Last data filed at 02/14/2023 1950 Gross per 24 hour  Intake 480 ml  Output 325 ml  Net 155 ml      Physical Exam    General:  Well appearing. No resp difficulty HEENT: Normal Neck: Supple. JVP 5-6 . Carotids 2+ bilat; no bruits. No lymphadenopathy or thyromegaly appreciated. Cor: PMI nondisplaced. Regular rate & rhythm. No rubs, gallops or murmurs. Lungs: Clear Abdomen: Soft, nontender, nondistended. No hepatosplenomegaly. No bruits or masses. Good bowel sounds. Extremities: No cyanosis, clubbing, rash, edema Neuro: Alert & orientedx3, cranial nerves grossly intact. moves all 4 extremities w/o difficulty. Affect pleasant   Telemetry   SR   EKG   N/A  Labs    CBC Recent Labs    02/14/23 1459 02/14/23 1507  HGB 15.6  16.0 14.6  HCT 46.0  47.0 43.0   Basic Metabolic Panel Recent Labs    16/10/96 0514 02/14/23 1459 02/14/23 1507 02/15/23 0644  NA 140   < > 144 138  K 3.6   < > 4.0 4.0  CL 109  --   --  105  CO2 23  --   --  23  GLUCOSE 98  --   --  100*  BUN 14  --   --  24*  CREATININE 1.08  --   --  1.70*  CALCIUM 9.1  --   --  9.0  MG 2.2  --   --  2.3   < > = values in this interval not displayed.   Liver Function Tests No results for input(s):  "AST", "ALT", "ALKPHOS", "BILITOT", "PROT", "ALBUMIN" in the last 72 hours. No results for input(s): "LIPASE", "AMYLASE" in the last 72 hours. Cardiac Enzymes No results for input(s): "CKTOTAL", "CKMB", "CKMBINDEX", "TROPONINI" in the last 72 hours.  BNP: BNP (last 3 results) Recent Labs    02/11/23 1034  BNP 877.8*    ProBNP (last 3 results) No results for input(s): "PROBNP" in the last 8760 hours.   D-Dimer No results for input(s): "DDIMER" in the last 72 hours. Hemoglobin A1C No results for input(s): "HGBA1C" in the last 72 hours. Fasting Lipid Panel No results for input(s): "CHOL", "HDL", "LDLCALC", "TRIG", "CHOLHDL", "LDLDIRECT" in the last 72 hours. Thyroid Function Tests Recent Labs    02/14/23 0514  TSH 1.908    Other results:   Imaging    CARDIAC CATHETERIZATION Result Date: 02/14/2023 HEMODYNAMICS: RA:   3-5 mmHg (mean) RV:   26/3 mmHg PA:   31/13 mmHg (22 mean) PCWP:  11 mmHg (mean)    Estimated Fick CO/CI   3.8 L/min, 1.7 L/min/m2 Thermodilution CO/CI   3.9 L/min, 1.7 L/min/m2    TPG  11  mmHg     PVR     2.8 Wood Units PAPi      6  IMPRESSION: Low pre and post capillary filling pressures. Severely reduced cardiac output and index Normal PVR Right dominant coronary circulation with no CAD consistent with nonischemic cardiomyopathy. Aditya Sabharwal Advanced Heart Failure 3:32 PM  George CARDIAC MORPHOLOGY W WO CONTRAST Result Date: 02/14/2023 CLINICAL DATA:  Cardiomyopathy, evaluate EF EXAM: CARDIAC MRI TECHNIQUE: The patient was scanned on a 1.5 Tesla Siemens magnet. A dedicated cardiac coil was used. Functional imaging was done using Fiesta sequences. 2,3, and 4 chamber views were done to assess for RWMA's. Modified Simpson's rule using a short axis stack was used to calculate an ejection fraction on a dedicated work [employer]. The patient received 14 cc of Gadavist. After 10 minutes inversion recovery sequences were used to assess for  infiltration and scar tissue. Velocity flow mapping performed in the ascending aorta and main pulmonary artery. CONTRAST:  14 cc  of Gadavist FINDINGS: 1. Moderate left ventricular size, normal LV wall thickness, severely reduced systolic function (LVEF = 13%). There is global hypokinesis. There is no late gadolinium enhancement in the left ventricular myocardium. LVEDV: 487 ml LVESV: 426 ml SV: 61 ml CO: 4.2 L/min Myocardial mass: 239 g Native T1 value 992 ms. (Normal < 1000 ms) LV ECV 30%.  (Normal <30%). 2. Normal right ventricular size and thickness. Severely reduced RV systolic function. There is global hypokinesis 3.  Mild left atrial size, normal right atrial size. 4. Normal size of the aortic root, ascending aorta and pulmonary artery. 5. Mild mitral regurgitation. no significant valvular abnormalities. 6.  Normal pericardium.  No pericardial effusion. IMPRESSION: 1. Moderately dilated LV size, severely reduced LV systolic function. LVEF 13%. 2.  No LGE or scar. 3.  No evidence for myocardial infiltration. 4.  Severely reduced RV function. 5.  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 Electronically Signed   By: Debbe Odea M.D.   On: 02/14/2023 15:35   George CARDIAC VELOCITY FLOW MAP Result Date: 02/14/2023 CLINICAL DATA:  Cardiomyopathy, evaluate EF EXAM: CARDIAC MRI TECHNIQUE: The patient was scanned on a 1.5 Tesla Siemens magnet. A dedicated cardiac coil was used. Functional imaging was done using Fiesta sequences. 2,3, and 4 chamber views were done to assess for RWMA's. Modified Simpson's rule using a short axis stack was used to calculate an ejection fraction on a dedicated work [employer]. The patient received 14 cc of Gadavist. After 10 minutes inversion recovery sequences were used to assess for infiltration and scar tissue. Velocity flow mapping performed in the ascending aorta and main pulmonary artery. CONTRAST:  14 cc  of Gadavist FINDINGS: 1.  Moderate left ventricular size, normal LV wall thickness, severely reduced systolic function (LVEF = 13%). There is global hypokinesis. There is no late gadolinium enhancement in the left ventricular myocardium. LVEDV: 487 ml LVESV: 426 ml SV: 61 ml CO: 4.2 L/min Myocardial mass: 239 g Native T1 value 992 ms. (Normal < 1000 ms) LV ECV 30%.  (Normal <30%). 2. Normal right ventricular size and thickness. Severely reduced RV systolic function. There is global hypokinesis 3.  Mild left atrial size, normal right atrial size. 4. Normal size of the aortic root, ascending aorta and pulmonary artery. 5. Mild mitral regurgitation. no significant valvular abnormalities. 6.  Normal pericardium.  No pericardial effusion. IMPRESSION: 1. Moderately dilated LV size, severely reduced LV systolic function. LVEF 13%. 2.  No  LGE or scar. 3.  No evidence for myocardial infiltration. 4.  Severely reduced RV function. 5.  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 Electronically Signed   By: Debbe Odea M.D.   On: 02/14/2023 15:35   George CARDIAC VELOCITY FLOW MAP Result Date: 02/14/2023 CLINICAL DATA:  Cardiomyopathy, evaluate EF EXAM: CARDIAC MRI TECHNIQUE: The patient was scanned on a 1.5 Tesla Siemens magnet. A dedicated cardiac coil was used. Functional imaging was done using Fiesta sequences. 2,3, and 4 chamber views were done to assess for RWMA's. Modified Simpson's rule using a short axis stack was used to calculate an ejection fraction on a dedicated work [employer]. The patient received 14 cc of Gadavist. After 10 minutes inversion recovery sequences were used to assess for infiltration and scar tissue. Velocity flow mapping performed in the ascending aorta and main pulmonary artery. CONTRAST:  14 cc  of Gadavist FINDINGS: 1. Moderate left ventricular size, normal LV wall thickness, severely reduced systolic function (LVEF = 13%). There is global hypokinesis. There is no late  gadolinium enhancement in the left ventricular myocardium. LVEDV: 487 ml LVESV: 426 ml SV: 61 ml CO: 4.2 L/min Myocardial mass: 239 g Native T1 value 992 ms. (Normal < 1000 ms) LV ECV 30%.  (Normal <30%). 2. Normal right ventricular size and thickness. Severely reduced RV systolic function. There is global hypokinesis 3.  Mild left atrial size, normal right atrial size. 4. Normal size of the aortic root, ascending aorta and pulmonary artery. 5. Mild mitral regurgitation. no significant valvular abnormalities. 6.  Normal pericardium.  No pericardial effusion. IMPRESSION: 1. Moderately dilated LV size, severely reduced LV systolic function. LVEF 13%. 2.  No LGE or scar. 3.  No evidence for myocardial infiltration. 4.  Severely reduced RV function. 5.  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 Electronically Signed   By: Debbe Odea M.D.   On: 02/14/2023 15:35     Medications:     Scheduled Medications:  dapagliflozin propanediol  10 mg Oral Daily   heparin  5,000 Units Subcutaneous Q8H   sacubitril-valsartan  1 tablet Oral BID   sodium chloride flush  3 mL Intravenous Q12H   spironolactone  25 mg Oral Daily    Infusions:  sodium chloride Stopped (02/14/23 1630)   sodium chloride      PRN Medications: sodium chloride, acetaminophen, albuterol, clonazePAM, hydrALAZINE, nitroGLYCERIN, ondansetron (ZOFRAN) IV, mouth rinse, senna-docusate, sodium chloride flush    Patient Profile   George Richards is a 42 year old with a history of HTN, OSA, and obesity. No previous cardiac history.    Admitted with Acute HFrEF.    Assessment/Plan   1. Acute Biventricular HFrEF Echo this admit. LVEF 20% RV mildly reduced. No WMA. BNP 877. Had viral illness in October. Suspect viral illness. No family history. HS Trop no trend. No chest pain  Cath- Cors ok. Low filling pressures and reduced CO/CI.  CMRI completed. LVEF 13% RV severely reduced. No scar. No infiltrative disease.   Appears euvolemic.Marland Kitchen Stop lasix and given some fluid back 250 NS bolus.  Continue spironolactone 25 mg daily Farxiga 10 mg daily  Entresto 24-26 mg twice a day  Lasix 20 mg as needed for lower extremity edema.   2. HTN Now running low.   3. AKI Suspect over diuresed, hypotension +/- contrast though less likely given 40 cc contrast given during cath.    Ok for discharge.   HF Meds for d/c  Spironolactone 25 mg  daily Farxiga 10 mg daily  Entresto 24-26 mg twice a day  Lasix 20 mg as needed for lower extremity edema.   HF follow has been made. Plan to check BMET at that time.   Length of Stay: 4  Loreta Blouch, NP  02/15/2023, 10:34 AM  Advanced Heart Failure Team Pager [phone] (M-F; 7a - 5p)  Please contact [facility] for night-coverage after hours (5p -7a ) and weekends on [email or web address]

## 2023-02-24 ENCOUNTER — Telehealth: Payer: Self-pay | Admitting: Physician Assistant

## 2023-02-24 ENCOUNTER — Other Ambulatory Visit: Payer: Self-pay | Admitting: Physician Assistant

## 2023-02-24 MED ORDER — BENZONATATE 100 MG PO CAPS: 100.0000 mg | ORAL_CAPSULE | Freq: Three times a day (TID) | ORAL | 0 refills | Status: DC | PRN

## 2023-02-24 NOTE — Telephone Encounter (Signed)
Patient had a viral illness back in October and admitted with heart failure in December.  He has been having dry cough since October.  Since his discharge, his weight has been fairly stable.  He does not have any lower extremity edema.  He denies any orthopnea as well.  He is on Lasix as needed.  He also takes spironolactone and Entresto.  It sounds like he is volume status is fairly stable.  I prescribed him some Tessalon Perle for cough.  He is aware to contact cardiology service if he does develop more heart failure symptoms including lower extremity edema, orthopnea or weight gain.

## 2023-02-26 ENCOUNTER — Other Ambulatory Visit: Payer: Self-pay

## 2023-02-26 ENCOUNTER — Emergency Department: Payer: Managed Care, Other (non HMO)

## 2023-02-26 DIAGNOSIS — I509 Heart failure, unspecified: Secondary | ICD-10-CM | POA: Insufficient documentation

## 2023-02-26 DIAGNOSIS — R079 Chest pain, unspecified: Secondary | ICD-10-CM | POA: Diagnosis not present

## 2023-02-26 DIAGNOSIS — Z5321 Procedure and treatment not carried out due to patient leaving prior to being seen by health care provider: Secondary | ICD-10-CM | POA: Insufficient documentation

## 2023-02-26 DIAGNOSIS — R0602 Shortness of breath: Secondary | ICD-10-CM | POA: Diagnosis present

## 2023-02-26 LAB — CBC
HCT: 45.6 % (ref 39.0–52.0)
Hemoglobin: 15 g/dL (ref 13.0–17.0)
MCH: 27.9 pg (ref 26.0–34.0)
MCHC: 32.9 g/dL (ref 30.0–36.0)
MCV: 84.9 fL (ref 80.0–100.0)
Platelets: 245 10*3/uL (ref 150–400)
RBC: 5.37 MIL/uL (ref 4.22–5.81)
RDW: 13.8 % (ref 11.5–15.5)
WBC: 6.5 10*3/uL (ref 4.0–10.5)
nRBC: 0 % (ref 0.0–0.2)

## 2023-02-26 LAB — BASIC METABOLIC PANEL
Anion gap: 11 (ref 5–15)
BUN: 24 mg/dL — ABNORMAL HIGH (ref 6–20)
CO2: 20 mmol/L — ABNORMAL LOW (ref 22–32)
Calcium: 8.8 mg/dL — ABNORMAL LOW (ref 8.9–10.3)
Chloride: 104 mmol/L (ref 98–111)
Creatinine, Ser: 1.14 mg/dL (ref 0.61–1.24)
GFR, Estimated: >60 mL/min (ref >60)
Glucose, Bld: 98 mg/dL (ref 70–99)
Potassium: 4.3 mmol/L (ref 3.5–5.1)
Sodium: 135 mmol/L (ref 135–145)

## 2023-02-26 LAB — BRAIN NATRIURETIC PEPTIDE: B Natriuretic Peptide: 467.5 pg/mL — ABNORMAL HIGH (ref 0.0–100.0)

## 2023-02-26 LAB — TROPONIN I (HIGH SENSITIVITY): Troponin I (High Sensitivity): 22 ng/L — ABNORMAL HIGH (ref <18)

## 2023-02-26 NOTE — ED Triage Notes (Addendum)
Pt to ED via POV c/o SHOB. Started this morning. Pt has hx of CHF. Was seen 2 weeks ago for same. Also endorses CP associated with [facility]

## 2023-02-27 ENCOUNTER — Other Ambulatory Visit: Payer: Self-pay

## 2023-02-27 ENCOUNTER — Encounter: Payer: Self-pay | Admitting: Family

## 2023-02-27 ENCOUNTER — Emergency Department
Admission: EM | Admit: 2023-02-27 | Discharge: 2023-02-27 | Disposition: A | Discharge status: Home / Self Care | Payer: Managed Care, Other (non HMO) | Attending: Family | Admitting: Family

## 2023-02-27 ENCOUNTER — Emergency Department (HOSPITAL_BASED_OUTPATIENT_CLINIC_OR_DEPARTMENT_OTHER): Payer: Managed Care, Other (non HMO) | Admitting: Family

## 2023-02-27 VITALS — BP 99/84 | HR 91 | Ht 71.0 in | Wt 248.0 lb

## 2023-02-27 DIAGNOSIS — J069 Acute upper respiratory infection, unspecified: Secondary | ICD-10-CM | POA: Diagnosis not present

## 2023-02-27 DIAGNOSIS — F419 Anxiety disorder, unspecified: Secondary | ICD-10-CM | POA: Diagnosis not present

## 2023-02-27 DIAGNOSIS — I10 Essential (primary) hypertension: Secondary | ICD-10-CM | POA: Diagnosis not present

## 2023-02-27 DIAGNOSIS — I5022 Chronic systolic (congestive) heart failure: Secondary | ICD-10-CM

## 2023-02-27 LAB — TROPONIN I (HIGH SENSITIVITY): Troponin I (High Sensitivity): 20 ng/L — ABNORMAL HIGH (ref <18)

## 2023-02-27 MED ORDER — TORSEMIDE 20 MG PO TABS: 20.0000 mg | ORAL_TABLET | ORAL | 11 refills | Status: DC

## 2023-02-27 MED ORDER — FLUTICASONE PROPIONATE 50 MCG/ACT NA SUSP
1.0000 | Freq: Every day | NASAL | 3 refills | Status: DC
  Filled 2023-02-27: qty 16, 30d supply, fill #0

## 2023-02-27 MED ORDER — GUAIFENESIN ER 600 MG PO TB12
600.0000 mg | ORAL_TABLET | Freq: Two times a day (BID) | ORAL | 3 refills | Status: DC
  Filled 2023-02-27: qty 60, 30d supply, fill #0

## 2023-02-27 MED ORDER — TORSEMIDE 20 MG PO TABS
20.0000 mg | ORAL_TABLET | ORAL | 11 refills | Status: DC
  Filled 2023-02-27: qty 16, 28d supply, fill #0

## 2023-02-27 MED ORDER — GUAIFENESIN ER 600 MG PO TB12: 600.0000 mg | ORAL_TABLET | Freq: Two times a day (BID) | ORAL | 3 refills | Status: DC

## 2023-02-27 MED ORDER — FLUTICASONE PROPIONATE 50 MCG/ACT NA SUSP: 1.0000 | Freq: Every day | NASAL | 3 refills | Status: DC

## 2023-02-27 NOTE — Patient Instructions (Addendum)
Stop Lasix  START TAKING Torsemide 20 MG on MONDAYS, WEDNESDAYS AND FRIDAYS AND SATURDAYS  Start taking Mucinex 600 Mg TWICE DAILY  Start using Flonase once daily

## 2023-02-27 NOTE — Progress Notes (Signed)
 Advanced Heart Failure Clinic Note   Referring Physician: recent admission George Richards: George Richards, No Cardiologist: George Richards  HF provider: Gardenia Led, MD  Chief Complaint: shortness of breath   HPI:  [name] is a 42 y/o male with a history of HTN, obesity, anxiety and chronic heart failure.   Admitted 02/11/23 due to feeling short of breath for 1.5 to 2 weeks, worse at night laying down. He has been sitting up to sleep over the past 2 weeks. Also admitted to swelling in his legs. BNP was elevated at 877.8. Started on IV Lasix . Echocardiogram showed EF 20% with grade 2 dd. CHF team was consulted, underwent cardiac MRI which showed reduced EF otherwise consistent with nonischemic disease. LHC/RHC showed reduced cardiac output and cardiac index otherwise no real evidence of CAD.  Discontinue Norvasc , continue Aldactone , Entresto  and Farxiga . Small fluid bolus given due to worsening kidney function.   Was in the ED 02/26/23 due to shortness of breath and chest pain. BNP 467.5. Troponin I 22 (was 30 two weeks ago). CXR showed mild HF. EKG showed NSR with prolonged QT and nonspecific T waves. He LWBS after sitting there for 9 hours without being seen.    Echo 02/13/23: 20-25% with Grade II DD , moderate LAE, mild RAE, mild/ moderate MR  cMRI 02/14/23:  1. Moderately dilated LV size, severely reduced LV systolic function. LVEF 13%.  2.  No LGE or scar.  3.  No evidence for myocardial infiltration.  4.  Severely reduced RV function.  5.  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  RHC/LHC 02/14/23: Low pre and post capillary filling pressures.  Severely reduced cardiac output and index Normal PVR Right dominant coronary circulation with no CAD consistent with nonischemic cardiomyopathy.   He presents today for his initial HF visit with a chief complaint of minimal shortness of breath with moderate exertion. Has associated anxiety, productive clear cough, some chest pain last  night, chest/ head congestion, wheezing with deep breath and trouble sleeping. He says that he sometimes has to sit up to sleep because he feels short of breath but thinks it may be more due to his cough and congestion. He denies palpitations, abdominal distention, pedal edema, dizziness or weight gain. Voices frustration with PRN lasix  because he doesn't know when he's supposed to take it. Took it 4 days in a row and says that he felt much better but his wife became concerned because of his kidney function so he stopped taking it. He doesn't know if he feels bad because of the new medications, his chest congestion or all of it together.   Has decreased his sodium to 1500-2000mg  along with cutting out all soft drinks. He was previously drinking 6 soda's daily. Now drinking ~ 60 oz water with occasional ice.   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   Review of Systems: [y] = yes, [ ]  = no   General: Weight gain [ ] ; Weight loss [ ] ; Anorexia [ ] ; Fatigue [ ] ; Fever [ ] ; Chills [ ] ; Weakness [ ]   Cardiac: Chest pain/pressure [email or web address] ]; Resting SOB [ ] ; Exertional SOB [email or web address] ]; Orthopnea [email or web address] ]; Pedal Edema [ ] ; Palpitations [ ] ; Syncope [ ] ; Presyncope [ ] ; Paroxysmal nocturnal dyspnea[ ]   Pulmonary: Cough [email or web address] ]; [facility] ]; Hemoptysis[ ] ; Sputum [ ] ; Snoring [ ]   GI: Vomiting[ ] ; Dysphagia[ ] ; Melena[ ] ; Hematochezia [ ] ; Heartburn[ ] ; Abdominal pain [ ] ;  Constipation [ ] ; Diarrhea [ ] ; BRBPR [ ]   GU: Hematuria[ ] ; Dysuria [ ] ; Nocturia[ ]   Vascular: Pain in legs with walking [ ] ; Pain in feet with lying flat [ ] ; Non-healing sores [ ] ; Stroke [ ] ; TIA [ ] ; Slurred speech [ ] ;  Neuro: Headaches[ ] ; Vertigo[ ] ; Seizures[ ] ; Paresthesias[ ] ;Blurred vision [ ] ; Diplopia [ ] ; Vision changes [ ]   Ortho/Skin: Arthritis [ ] ; Joint pain [ ] ; Muscle pain [ ] ; Joint swelling [ ] ; Back Pain [ ] ; Rash [ ]   Psych: Depression[ ] ; Anxiety[y ]  Heme: Bleeding problems [ ] ; Clotting  disorders [ ] ; Anemia [ ]   Endocrine: Diabetes [ ] ; Thyroid dysfunction[ ]    Past Medical History:  Diagnosis Date   Anxiety    History of kidney stones    Hypertension    Lumbar disc herniation with radiculopathy    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 Clois   Lumbar radiculopathy, chronic    Followed by Dr. Clois, has asymmetric disc herniation at L5-S1    Current Outpatient Medications  Medication Sig Dispense Refill   benzonatate  (TESSALON  PERLES) 100 MG capsule Take 1 capsule (100 mg total) by mouth 3 (three) times daily as needed for cough. 30 capsule 0   albuterol  (VENTOLIN  HFA) 108 (90 Base) MCG/ACT inhaler Inhale 2 puffs into the lungs every 4 (four) hours as needed for wheezing or shortness of breath. 6.7 g 0   clonazePAM  (KLONOPIN ) 1 MG tablet Take 1 tablet (1 mg total) by mouth 2 (two) times daily as needed for anxiety. 15 tablet 0   dapagliflozin  propanediol (FARXIGA ) 10 MG TABS tablet Take 1 tablet (10 mg total) by mouth daily. 30 tablet 0   furosemide  (LASIX ) 20 MG tablet Take 1 tablet (20 mg total) by mouth daily as needed for fluid or edema. 30 tablet 0   sacubitril -valsartan  (ENTRESTO ) 24-26 MG Take 1 tablet by mouth 2 (two) times daily. 60 tablet 0   spironolactone  (ALDACTONE ) 25 MG tablet Take 1 tablet (25 mg total) by mouth daily. 30 tablet 0   No current facility-administered medications for this visit.    No Known Allergies    Social History   Socioeconomic History   Marital status: Married    Spouse name: Not on file   Number of children: Not on file   Years of education: Not on file   Highest education level: Not on file  Occupational History   Not on file  Tobacco Use   Smoking status: Never   Smokeless tobacco: Current    Types: Chew  Vaping Use   Vaping status: Never Used  Substance and Sexual Activity   Alcohol use: No   Drug use: No   Sexual activity: Yes    Partners: Female  Other Topics Concern   Not on file   Social History Narrative   Not on file   Social Drivers of Health   Financial Resource Strain: Not on file  Food Insecurity: No Food Insecurity (02/13/2023)   Hunger Vital Sign    Worried About Running Out of Food in the Last Year: Never true    Ran Out of Food in the Last Year: Never true  Transportation Needs: No Transportation Needs (02/15/2023)   PRAPARE - Administrator, Civil Service (Medical): No    Lack of Transportation (Non-Medical): No  Physical Activity: Not on file  Stress: Not on file  Social Connections: Not on file  Intimate  Partner Violence: Not At Risk (02/13/2023)   Humiliation, Afraid, Rape, and Kick questionnaire    Fear of Current or Ex-Partner: No    Emotionally Abused: No    Physically Abused: No    Sexually Abused: No      Family History  Problem Relation Age of Onset   Hypertension Mother    Diabetes Mother    Stroke Mother    Obesity Father    Vitals:   02/27/23 1418  BP: 99/84  Pulse: 91  SpO2: 96%  Weight: 248 lb (112.5 kg)  Height: 5' 11 (1.803 m)   Wt Readings from Last 3 Encounters:  02/27/23 248 lb (112.5 kg)  02/15/23 254 lb 10.1 oz (115.5 kg)  05/10/19 250 lb (113.4 kg)   Lab Results  Component Value Date   CREATININE 1.14 02/26/2023   CREATININE 1.70 (H) 02/15/2023   CREATININE 1.08 02/14/2023    PHYSICAL EXAM: General:  Well appearing. No respiratory difficulty HEENT: normal Neck: supple. no JVD. No lymphadenopathy or thyromegaly appreciated. Cor: PMI nondisplaced. Regular rate & rhythm. No rubs, gallops or murmurs. Lungs: rhonchi in bilateral lower lobes Abdomen: soft, nontender, nondistended. No hepatosplenomegaly. No bruits or masses.  Extremities: no cyanosis, clubbing, rash, edema Neuro: alert & oriented x 3, cranial nerves grossly intact. moves all 4 extremities w/o difficulty. Affect pleasant.  ECG: not done  ReDs: 33%   ASSESSMENT & PLAN:  1: NICM with reduced ejection fraction- - suspect  due to HTN or viral illness as recent cath without CAD - NYHA Richards II - euvolemic - weighing daily and home weight ranges from 246-249 pounds; reminded to call for overnight weight gain of > 2 pounds or weekly weight gain of > 5 pounds - ReDs 33% - Echo 02/13/23: 20-25% with Grade II DD , moderate LAE, mild RAE, mild/ moderate MR - cMRI 02/14/23:    1. Moderately dilated LV size, severely reduced LV systolic function. LVEF 13%.    2.  No LGE or scar.    3.  No evidence for myocardial infiltration.    4.  Severely reduced RV function.    5.  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 - continue farxiga  10mg  daily - stop furosemide  and begin torsemide  20mg  M, W, F & Sat as he says PRN dosing is difficult for him to know when to take it - continue entresto  24/26mg  BID - continue spironolactone  25mg  daily - unable to titrate GDMT due to BP - BMET next visit - can return to work but only light duty for now - BNP 02/26/23 was 467.5  2: HTN- - BP 99/84 - needs George Richards - BMET 02/26/23 showed sodium 135, potassium 4.3, creatinine 1.14 & GFR >60 - BMET next visit  3: Anxiety- - admits that his anxiety is worse due to his shortness of breath which then makes his anxiety worse - continue klonopin  1mg  BID PRN  4: URI- - begin flonase  nasal spray daily - begin mucinex  600mg  BID   Return in 2 weeks, sooner if needed. Will then f/u with Dr. Gardenia after that  George DELENA Class, FNP 02/27/23

## 2023-03-01 ENCOUNTER — Encounter: Payer: Self-pay | Admitting: Family

## 2023-03-09 NOTE — Progress Notes (Signed)
 Advanced Heart Failure Clinic Note    PCP: Pcp, No Cardiologist: None  HF provider: Gardenia Led, MD  Chief Complaint: fatigue   HPI:  George Richards is a 43 y/o male with a history of HTN, obesity, anxiety and chronic heart failure.   Admitted 02/11/23 due to feeling short of breath for 1.5 to 2 weeks, worse at night laying down. He has been sitting up to sleep over the past 2 weeks. Also admitted to swelling in his legs. BNP was elevated at 877.8. Started on IV Lasix . Echocardiogram showed EF 20% with grade 2 dd. CHF team was consulted, underwent cardiac MRI which showed reduced EF otherwise consistent with nonischemic disease. LHC/RHC showed reduced cardiac output and cardiac index otherwise no real evidence of CAD.  Discontinue Norvasc , continue Aldactone , Entresto  and Farxiga . Small fluid bolus given due to worsening kidney function.   Was in the ED 02/26/23 due to shortness of breath and chest pain. BNP 467.5. Troponin I 22 (was 30 two weeks ago). CXR showed mild HF. EKG showed NSR with prolonged QT and nonspecific T waves. He LWBS after sitting there for 9 hours without being seen.    Echo 02/13/23: 20-25% with Grade II DD, moderate LAE, mild RAE, mild/ moderate George  cMRI 02/14/23:  1. Moderately dilated LV size, severely reduced LV systolic function. LVEF 13%.  2.  No LGE or scar.  3.  No evidence for myocardial infiltration.  4.  Severely reduced RV function.  5.  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  RHC/LHC 02/14/23: Low pre and post capillary filling pressures.  Severely reduced cardiac output and index Normal PVR Right dominant coronary circulation with no CAD consistent with nonischemic cardiomyopathy.   He presents today for a HF follow-up visit with a chief complaint of minimal fatigue. Overall he says that he feels much better since he was last here 2 weeks ago. Denies cough, chest pain, palpitations, head/chest congestion, shortness of  breath or difficulty sleeping. He says that he no longer feels anxious because he doesn't have the pressure/ heaviness in his chest anymore. At last visit, diuretic was changed to torsemide  20mg  M, W, F & Sat. He says that when he takes the back to back dose (F & Sat) that he feels even better and urinates a lot more and is wondering if he can take it every day.   Checks his blood pressure at home and says that it generally ranges from 118-120/ 85 but occasionally the SBP gets lower than 100. When that happens, he will break the PM dose of entresto  in half or skip it entirely. He says that he's only skipped a couple of doses since last here.   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   ROS: All systems negative except as listed in HPI, PMH and Problem List.  SH:  Social History   Socioeconomic History   Marital status: Married    Spouse name: Not on file   Number of children: Not on file   Years of education: Not on file   Highest education level: Not on file  Occupational History   Not on file  Tobacco Use   Smoking status: Never   Smokeless tobacco: Current    Types: Chew  Vaping Use   Vaping status: Never Used  Substance and Sexual Activity   Alcohol use: No   Drug use: No   Sexual activity: Yes    Partners: Female  Other Topics Concern  Not on file  Social History Narrative   Not on file   Social Drivers of Health   Financial Resource Strain: Not on file  Food Insecurity: No Food Insecurity (02/13/2023)   Hunger Vital Sign    Worried About Running Out of Food in the Last Year: Never true    Ran Out of Food in the Last Year: Never true  Transportation Needs: No Transportation Needs (02/15/2023)   PRAPARE - Administrator, Civil Service (Medical): No    Lack of Transportation (Non-Medical): No  Physical Activity: Not on file  Stress: Not on file  Social Connections: Not on file  Intimate Partner Violence: Not At Risk (02/13/2023)   Humiliation, Afraid,  Rape, and Kick questionnaire    Fear of Current or Ex-Partner: No    Emotionally Abused: No    Physically Abused: No    Sexually Abused: No    FH:  Family History  Problem Relation Age of Onset   Hypertension Mother    Diabetes Mother    Stroke Mother    Obesity Father     Past Medical History:  Diagnosis Date   Anxiety    History of kidney stones    Hypertension    Lumbar disc herniation with radiculopathy    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 Clois   Lumbar radiculopathy, chronic    Followed by Dr. Clois, has asymmetric disc herniation at L5-S1    Current Outpatient Medications  Medication Sig Dispense Refill   benzonatate  (TESSALON  PERLES) 100 MG capsule Take 1 capsule (100 mg total) by mouth 3 (three) times daily as needed for cough. 30 capsule 0   clonazePAM  (KLONOPIN ) 1 MG tablet Take 1 tablet (1 mg total) by mouth 2 (two) times daily as needed for anxiety. 15 tablet 0   dapagliflozin  propanediol (FARXIGA ) 10 MG TABS tablet Take 1 tablet (10 mg total) by mouth daily. 30 tablet 0   fluticasone  (FLONASE ) 50 MCG/ACT nasal spray Place 1 spray into both nostrils daily. 16 g 3   guaiFENesin  (MUCINEX ) 600 MG 12 hr tablet Take 1 tablet (600 mg total) by mouth 2 (two) times daily. 60 tablet 3   sacubitril -valsartan  (ENTRESTO ) 24-26 MG Take 1 tablet by mouth 2 (two) times daily. 60 tablet 0   spironolactone  (ALDACTONE ) 25 MG tablet Take 1 tablet (25 mg total) by mouth daily. 30 tablet 0   torsemide  (DEMADEX ) 20 MG tablet Take 1 tablet (20 mg total) by mouth 4 (four) times a week. Take Torsemide  20 MG on MONDAYS, WEDNESDAYS AND FRIDAYS AND SATURDAYS 16 tablet 11   No current facility-administered medications for this visit.   Vitals:   03/13/23 1420  BP: (!) 130/97  Pulse: 86  SpO2: 100%  Weight: 247 lb (112 kg)  Height: 5' 10 (1.778 m)   Wt Readings from Last 3 Encounters:  03/13/23 247 lb (112 kg)  02/27/23 248 lb (112.5 kg)  02/15/23 254 lb  10.1 oz (115.5 kg)   Lab Results  Component Value Date   CREATININE 1.14 02/26/2023   CREATININE 1.70 (H) 02/15/2023   CREATININE 1.08 02/14/2023   PHYSICAL EXAM:  General:  Well appearing. No resp difficulty HEENT: normal Neck: supple. JVP flat. No lymphadenopathy or thryomegaly appreciated. Cor: PMI normal. Regular rate & rhythm. No rubs, gallops or murmurs. Lungs: clear Abdomen: soft, nontender, nondistended. No hepatosplenomegaly. No bruits or masses.  Extremities: no cyanosis, clubbing, rash, edema Neuro: alert & oriented x3, cranial nerves grossly  intact. Moves all 4 extremities w/o difficulty. Affect pleasant.   ECG: not done   ASSESSMENT & PLAN:  1: NICM with reduced ejection fraction- - suspect due to HTN or viral illness as recent cath without CAD - NYHA class II - euvolemic - weighing daily and home weight ranges from 246-249 pounds; reminded to call for overnight weight gain of > 2 pounds or weekly weight gain of > 5 pounds - weight stable from last visit here 2 weeks ago - Echo 02/13/23: 20-25% with Grade II DD , moderate LAE, mild RAE, mild/ moderate George - cMRI 02/14/23:    1. Moderately dilated LV size, severely reduced LV systolic function. LVEF 13%.    2.  No LGE or scar.    3.  No evidence for myocardial infiltration.    4.  Severely reduced RV function.    5.  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 - continue farxiga  10mg  daily - continue torsemide  20mg  M, W, F & Sat; BMET today and discussed doing 10mg  daily after results received - continue entresto  24/26mg  BID; if he finds that he's skipping this more often, may need to change it to losartan - continue spironolactone  25mg  daily - unable to titrate GDMT due to home BP readings - BMET today - BNP 02/26/23 was 467.5  2: HTN- - BP 130/97; says it's elevated today because he had to park his work truck all the way across the parking lot - needs PCP - BMET 02/26/23 showed sodium  135, potassium 4.3, creatinine 1.14 & GFR >60 - BMET today  3: Anxiety- - resolved as he says that he no longer has chest pressure/ tightness  4: URI- - resolved  Return in 2 weeks, sooner if needed.

## 2023-03-13 ENCOUNTER — Ambulatory Visit: Payer: Managed Care, Other (non HMO) | Admitting: Family

## 2023-03-13 ENCOUNTER — Other Ambulatory Visit
Admission: RE | Admit: 2023-03-13 | Discharge: 2023-03-13 | Disposition: A | Discharge status: Home / Self Care | Payer: Managed Care, Other (non HMO) | Source: Ambulatory Visit | Attending: Family | Admitting: Family

## 2023-03-13 VITALS — BP 130/97 | HR 86 | Ht 70.0 in | Wt 247.0 lb

## 2023-03-13 DIAGNOSIS — J069 Acute upper respiratory infection, unspecified: Secondary | ICD-10-CM

## 2023-03-13 DIAGNOSIS — I5022 Chronic systolic (congestive) heart failure: Secondary | ICD-10-CM | POA: Insufficient documentation

## 2023-03-13 DIAGNOSIS — I10 Essential (primary) hypertension: Secondary | ICD-10-CM | POA: Diagnosis not present

## 2023-03-13 DIAGNOSIS — F419 Anxiety disorder, unspecified: Secondary | ICD-10-CM | POA: Diagnosis not present

## 2023-03-13 LAB — BASIC METABOLIC PANEL
Anion gap: 7 (ref 5–15)
BUN: 26 mg/dL — ABNORMAL HIGH (ref 6–20)
CO2: 26 mmol/L (ref 22–32)
Calcium: 9.2 mg/dL (ref 8.9–10.3)
Chloride: 107 mmol/L (ref 98–111)
Creatinine, Ser: 1.36 mg/dL — ABNORMAL HIGH (ref 0.61–1.24)
GFR, Estimated: >60 mL/min (ref >60)
Glucose, Bld: 93 mg/dL (ref 70–99)
Potassium: 4.1 mmol/L (ref 3.5–5.1)
Sodium: 140 mmol/L (ref 135–145)

## 2023-03-13 NOTE — Patient Instructions (Signed)
 Go over to the [facility]. Go pass the gift shop and have your blood work completed.  We will only call you if the results are abnormal or if the provider would like to make medication changes (torsemide).

## 2023-03-14 ENCOUNTER — Other Ambulatory Visit: Payer: Self-pay

## 2023-03-14 ENCOUNTER — Telehealth: Payer: Self-pay

## 2023-03-14 MED ORDER — SACUBITRIL-VALSARTAN 24-26 MG PO TABS: 1.0000 | ORAL_TABLET | Freq: Two times a day (BID) | ORAL | 3 refills | Status: DC

## 2023-03-14 MED ORDER — SPIRONOLACTONE 25 MG PO TABS: 25.0000 mg | ORAL_TABLET | Freq: Every day | ORAL | 3 refills | Status: DC

## 2023-03-14 MED ORDER — DAPAGLIFLOZIN PROPANEDIOL 10 MG PO TABS: 10.0000 mg | ORAL_TABLET | Freq: Every day | ORAL | 3 refills | Status: DC

## 2023-03-14 NOTE — Telephone Encounter (Addendum)
 Pt verbalized understanding. States he is drinking 60-64 ounces of fluid daily, and he will start taking torsemide  10 mg daily. He denies any questions or concerns at this time. He requested refills of cardiac medications, farxiga , spironolactone  and entresto  be sent to [facility] in [place].  Refills sent.   ----- Message from Charlette Console sent at 03/13/2023  3:50 PM EST ----- Potassium level is normal. Kidney function is a little worse from 2 weeks ago but not as bad as at discharge. Change your torsemide  from 20mg  four days/ week to 10mg  every day. This is a little bit of a reduction in the total weekly dose which will help the kidney function. Make sure drinking 60-64 ounces of fluid daily. Recheck BMET at next visit in 2 weeks.

## 2023-03-20 ENCOUNTER — Encounter: Payer: Self-pay | Admitting: Family

## 2023-03-20 ENCOUNTER — Other Ambulatory Visit: Payer: Self-pay

## 2023-03-20 ENCOUNTER — Other Ambulatory Visit: Payer: Self-pay | Admitting: Family

## 2023-03-20 MED ORDER — TORSEMIDE 10 MG PO TABS
10.0000 mg | ORAL_TABLET | Freq: Every day | ORAL | 3 refills | Status: DC
  Filled 2023-03-20: qty 30, 30d supply, fill #0

## 2023-03-26 ENCOUNTER — Ambulatory Visit: Payer: Managed Care, Other (non HMO) | Attending: Cardiology | Admitting: Cardiology

## 2023-03-26 VITALS — BP 119/96 | HR 80 | Ht 70.0 in | Wt 250.0 lb

## 2023-03-26 DIAGNOSIS — I5022 Chronic systolic (congestive) heart failure: Secondary | ICD-10-CM | POA: Diagnosis not present

## 2023-03-26 MED ORDER — METOPROLOL SUCCINATE ER 25 MG PO TB24: 12.5000 mg | ORAL_TABLET | Freq: Every day | ORAL | 3 refills | Status: DC
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
## 2023-03-26 NOTE — Patient Instructions (Addendum)
Medication Changes:  START: METOPROLOL SUCCINATE 12.5MG  ONCE DAILY   Lab Work:  Go DOWN to LOWER LEVEL (LL) to have your blood work completed inside of [employer] office.  We will only call you if the results are abnormal or if the provider would like to make medication changes.  Follow-Up in: 2 WEEKS AS SCHEDULED WITH PHARMD (NASON)   THEN AGAIN IN 1 MONTH WITH DR. Gasper Lloyd AS SCHEDULED    If you have any questions or concerns before your next appointment please send [place] a message through mychart or call our office at [phone] Monday-Friday 8 am-5 pm.   If you have an urgent need after hours on the weekend please call your Primary Cardiologist or the Advanced Heart Failure Clinic in [place] at [phone].   At the Advanced Heart Failure Clinic, you and your health needs are our priority. We have a designated team specialized in the treatment of Heart Failure. This Care Team includes your primary Heart Failure Specialized Cardiologist (physician), Advanced Practice Providers (APPs- Physician Assistants and Nurse Practitioners), and Pharmacist who all work together to provide you with the care you need, when you need it.   You may see any of the following providers on your designated Care Team at your next follow up:  Dr. Arvilla Meres Dr. Marca Ancona Dr. Dorthula Nettles Dr. Theresia Bough Tonye Becket, NP Robbie Lis, [place] [address] [place], [place] Brynda Peon, NP [place] Lee, NP Clarisa Kindred, NP Enos Fling, PharmD

## 2023-03-27 LAB — BASIC METABOLIC PANEL
BUN/Creatinine Ratio: 13 (ref 9–20)
BUN: 18 mg/dL (ref 6–24)
CO2: 23 mmol/L (ref 20–29)
Calcium: 9.6 mg/dL (ref 8.7–10.2)
Chloride: 103 mmol/L (ref 96–106)
Creatinine, Ser: 1.36 mg/dL — ABNORMAL HIGH (ref 0.76–1.27)
Glucose: 86 mg/dL (ref 70–99)
Potassium: 4.2 mmol/L (ref 3.5–5.2)
Sodium: 141 mmol/L (ref 134–144)
eGFR: 66 mL/min/{1.73_m2} (ref >59)

## 2023-03-27 LAB — BRAIN NATRIURETIC PEPTIDE: BNP: 380 pg/mL — ABNORMAL HIGH (ref 0.0–100.0)

## 2023-04-03 ENCOUNTER — Other Ambulatory Visit: Payer: Self-pay

## 2023-04-09 ENCOUNTER — Telehealth: Payer: Self-pay | Admitting: Family

## 2023-04-09 NOTE — Telephone Encounter (Signed)
 Pt confirmed appt for 04/10/23
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
## 2023-04-10 ENCOUNTER — Other Ambulatory Visit: Payer: Self-pay

## 2023-04-10 ENCOUNTER — Ambulatory Visit: Payer: Managed Care, Other (non HMO) | Attending: Cardiology | Admitting: Pharmacist

## 2023-04-10 VITALS — BP 98/70 | HR 85 | Wt 249.4 lb

## 2023-04-10 DIAGNOSIS — I5022 Chronic systolic (congestive) heart failure: Secondary | ICD-10-CM | POA: Diagnosis not present

## 2023-04-10 MED ORDER — METOPROLOL SUCCINATE ER 25 MG PO TB24
25.0000 mg | ORAL_TABLET | Freq: Every day | ORAL | 3 refills | Status: DC
  Filled 2023-04-10: qty 90, 90d supply, fill #0

## 2023-04-10 NOTE — Addendum Note (Signed)
Addended by: Marygrace Drought on: 04/10/2023 02:48 PM   Modules accepted: Orders

## 2023-04-10 NOTE — Patient Instructions (Addendum)
It was a pleasure seeing you today!  MEDICATIONS: -Increase metoprolol succinate to 25 mg daily -Call if you have questions about your medications.  LABS: -We will call you if your labs need attention.  NEXT APPOINTMENT: Return to clinic on 04/23/23 with Dr. Gasper Lloyd.  In general, to take care of your heart failure: -Limit your fluid intake to 2 Liters (half-gallon) per day.   -Limit your salt intake to ideally 2-3 grams (2000-3000 mg) per day. -Weigh yourself daily and record, and bring that "weight diary" to your next appointment.  (Weight gain of 2-3 pounds in 1 day typically means fluid weight.) -The medications for your heart are to help your heart and help you live longer.   -Please contact us before stopping any of your heart medications.  Call the clinic at [phone] with questions or to reschedule future appointments.

## 2023-04-11 ENCOUNTER — Encounter: Payer: Self-pay | Admitting: Family

## 2023-04-20 ENCOUNTER — Telehealth: Payer: Self-pay | Admitting: Cardiology

## 2023-04-20 NOTE — Telephone Encounter (Signed)
Pt confirmed appt for 04/23/23

## 2023-04-23 ENCOUNTER — Encounter: Payer: Managed Care, Other (non HMO) | Admitting: Cardiology

## 2023-05-21 ENCOUNTER — Ambulatory Visit: Payer: Managed Care, Other (non HMO) | Attending: Cardiology | Admitting: Cardiology

## 2023-05-21 ENCOUNTER — Other Ambulatory Visit: Payer: Self-pay

## 2023-05-21 ENCOUNTER — Encounter: Payer: Self-pay | Admitting: Cardiology

## 2023-05-21 VITALS — BP 116/85 | HR 60 | Wt 248.0 lb

## 2023-05-21 DIAGNOSIS — I5022 Chronic systolic (congestive) heart failure: Secondary | ICD-10-CM

## 2023-05-21 MED ORDER — ENTRESTO 49-51 MG PO TABS
1.0000 | ORAL_TABLET | Freq: Two times a day (BID) | ORAL | 3 refills | Status: DC
  Filled 2023-05-21: qty 60, 30d supply, fill #0

## 2023-05-21 MED ORDER — ENTRESTO 49-51 MG PO TABS: 1.0000 | ORAL_TABLET | Freq: Two times a day (BID) | ORAL | 3 refills | Status: DC

## 2023-05-21 NOTE — Patient Instructions (Addendum)
 Medication Changes:  INCREASE YOUR ENTRESTO TO 49-51 MG TWICE DAILY  Lab Work:  Go DOWN to LOWER LEVEL (LL) to have your blood work completed inside of [employer] office.  We will only call you if the results are abnormal or if the provider would like to make medication changes.   Testing/Procedures:  Please have your echo completed Wednesday, April 23rd, 2025 @ 9:00 AM. Bonita Quin will check in for this at the [facility]ICAL MALL. You have to arrive 15 MINS EARLY for preparation, otherwise you will have to reschedule.   Follow-Up in: 2 MONTHS WITH DR. Gasper Lloyd  At the Advanced Heart Failure Clinic, you and your health needs are our priority. We have a designated team specialized in the treatment of Heart Failure. This Care Team includes your primary Heart Failure Specialized Cardiologist (physician), Advanced Practice Providers (APPs- Physician Assistants and Nurse Practitioners), and Pharmacist who all work together to provide you with the care you need, when you need it.   You may see any of the following providers on your designated Care Team at your next follow up:  Dr. Arvilla Meres Dr. Marca Ancona Dr. Dorthula Nettles Dr. Theresia Bough Clarisa Kindred, FNP Enos Fling, RPH-CPP  Please be sure to bring in all your medications bottles to every appointment.   Need to Contact us:  If you have any questions or concerns before your next appointment please send [place] a message through [place] or call our office at [phone].    TO LEAVE A MESSAGE FOR THE NURSE SELECT OPTION 2, PLEASE LEAVE A MESSAGE INCLUDING: YOUR NAME DATE OF BIRTH CALL BACK NUMBER REASON FOR CALL**this is important as we prioritize the call backs  YOU WILL RECEIVE A CALL BACK THE SAME DAY AS LONG AS YOU CALL BEFORE 4:00 PM
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
## 2023-05-21 NOTE — Addendum Note (Signed)
 Addended by: Jola Schmidt A on: 05/21/2023 04:18 PM   Modules accepted: Orders

## 2023-05-22 ENCOUNTER — Encounter: Payer: Self-pay | Admitting: Pharmacist

## 2023-05-22 ENCOUNTER — Other Ambulatory Visit: Payer: Self-pay

## 2023-05-22 LAB — BASIC METABOLIC PANEL
BUN/Creatinine Ratio: 15 (ref 9–20)
BUN: 21 mg/dL (ref 6–24)
CO2: 22 mmol/L (ref 20–29)
Calcium: 9.4 mg/dL (ref 8.7–10.2)
Chloride: 104 mmol/L (ref 96–106)
Creatinine, Ser: 1.4 mg/dL — ABNORMAL HIGH (ref 0.76–1.27)
Glucose: 87 mg/dL (ref 70–99)
Potassium: 4.4 mmol/L (ref 3.5–5.2)
Sodium: 142 mmol/L (ref 134–144)
eGFR: 64 mL/min/{1.73_m2} (ref >59)

## 2023-05-22 LAB — BRAIN NATRIURETIC PEPTIDE: BNP: 204.1 pg/mL — ABNORMAL HIGH (ref 0.0–100.0)

## 2023-05-23 ENCOUNTER — Other Ambulatory Visit: Payer: Self-pay

## 2023-05-23 MED ORDER — METOPROLOL SUCCINATE ER 25 MG PO TB24: 25.0000 mg | ORAL_TABLET | Freq: Every day | ORAL | 3 refills | Status: DC

## 2023-05-23 MED ORDER — TORSEMIDE 10 MG PO TABS: 10.0000 mg | ORAL_TABLET | Freq: Every day | ORAL | 3 refills | Status: DC

## 2023-06-20 ENCOUNTER — Ambulatory Visit
Admission: RE | Admit: 2023-06-20 | Discharge: 2023-06-20 | Disposition: A | Discharge status: Home / Self Care | Source: Ambulatory Visit | Attending: Cardiology | Admitting: Cardiology

## 2023-06-20 DIAGNOSIS — I5022 Chronic systolic (congestive) heart failure: Secondary | ICD-10-CM | POA: Insufficient documentation

## 2023-06-20 DIAGNOSIS — I110 Hypertensive heart disease with heart failure: Secondary | ICD-10-CM | POA: Insufficient documentation

## 2023-06-20 LAB — ECHOCARDIOGRAM COMPLETE
AR max vel: 2.62 cm2
AV Area VTI: 2.81 cm2
AV Area mean vel: 2.55 cm2
AV Mean grad: 3 mmHg
AV Peak grad: 4.8 mmHg
Ao pk vel: 1.09 m/s
Area-P 1/2: 3.08 cm2
Calc EF: 26.2 %
MV VTI: 2.55 cm2
S' Lateral: 6.3 cm
Single Plane A2C EF: 13.7 %
Single Plane A4C EF: 38.5 %

## 2023-06-20 NOTE — Progress Notes (Signed)
*  PRELIMINARY RESULTS* Echocardiogram 2D Echocardiogram has been performed.  George Richards 06/20/2023, 9:43 AM

## 2023-06-30 ENCOUNTER — Other Ambulatory Visit: Payer: Self-pay | Admitting: Family

## 2023-07-15 ENCOUNTER — Other Ambulatory Visit: Payer: Self-pay | Admitting: Family

## 2023-08-24 ENCOUNTER — Telehealth: Payer: Self-pay | Admitting: Cardiology

## 2023-08-24 NOTE — Telephone Encounter (Signed)
 Called to confirm/remind patient of their appointment at the Advanced Heart Failure Clinic on 08/27/23.   Appointment:   [x] Confirmed  [] Left mess   [] No answer/No voice mail  [] VM Full/unable to leave message  [] Phone not in service  Patient reminded to bring all medications and/or complete list.  Confirmed patient has transportation. Gave directions, instructed to utilize valet parking.

## 2023-08-27 ENCOUNTER — Other Ambulatory Visit
Admission: RE | Admit: 2023-08-27 | Discharge: 2023-08-27 | Disposition: A | Discharge status: Home / Self Care | Source: Ambulatory Visit | Attending: Cardiology | Admitting: Cardiology

## 2023-08-27 ENCOUNTER — Ambulatory Visit (HOSPITAL_BASED_OUTPATIENT_CLINIC_OR_DEPARTMENT_OTHER): Admitting: Cardiology

## 2023-08-27 ENCOUNTER — Other Ambulatory Visit: Payer: Self-pay

## 2023-08-27 VITALS — BP 114/77 | HR 89 | Wt 249.0 lb

## 2023-08-27 DIAGNOSIS — Z8616 Personal history of COVID-19: Secondary | ICD-10-CM | POA: Insufficient documentation

## 2023-08-27 DIAGNOSIS — I428 Other cardiomyopathies: Secondary | ICD-10-CM | POA: Diagnosis not present

## 2023-08-27 DIAGNOSIS — I110 Hypertensive heart disease with heart failure: Secondary | ICD-10-CM | POA: Insufficient documentation

## 2023-08-27 DIAGNOSIS — Z6835 Body mass index (BMI) 35.0-35.9, adult: Secondary | ICD-10-CM | POA: Insufficient documentation

## 2023-08-27 DIAGNOSIS — I5020 Unspecified systolic (congestive) heart failure: Secondary | ICD-10-CM

## 2023-08-27 DIAGNOSIS — I10 Essential (primary) hypertension: Secondary | ICD-10-CM | POA: Diagnosis not present

## 2023-08-27 DIAGNOSIS — E669 Obesity, unspecified: Secondary | ICD-10-CM | POA: Diagnosis not present

## 2023-08-27 DIAGNOSIS — Z7984 Long term (current) use of oral hypoglycemic drugs: Secondary | ICD-10-CM | POA: Insufficient documentation

## 2023-08-27 DIAGNOSIS — E66812 Obesity, class 2: Secondary | ICD-10-CM

## 2023-08-27 LAB — BASIC METABOLIC PANEL WITH GFR
Anion gap: 10 (ref 5–15)
BUN: 20 mg/dL (ref 6–20)
CO2: 23 mmol/L (ref 22–32)
Calcium: 9 mg/dL (ref 8.9–10.3)
Chloride: 108 mmol/L (ref 98–111)
Creatinine, Ser: 1.2 mg/dL (ref 0.61–1.24)
GFR, Estimated: >60 mL/min (ref >60)
Glucose, Bld: 110 mg/dL — ABNORMAL HIGH (ref 70–99)
Potassium: 3.6 mmol/L (ref 3.5–5.1)
Sodium: 141 mmol/L (ref 135–145)

## 2023-08-27 LAB — BRAIN NATRIURETIC PEPTIDE: B Natriuretic Peptide: 73.1 pg/mL (ref 0.0–100.0)

## 2023-08-27 MED ORDER — METOPROLOL SUCCINATE ER 50 MG PO TB24
50.0000 mg | ORAL_TABLET | Freq: Every day | ORAL | 3 refills | Status: DC
  Filled 2023-08-27: qty 30, 30d supply, fill #0
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
## 2023-08-27 NOTE — Patient Instructions (Signed)
 Medication Changes:  INCREASE METOPROLOL  TO 50 MG ONCE DAILY   Lab Work:  Go over to the MEDICAL MALL. Go pass the gift shop and have your blood work completed.  We will only call you if the results are abnormal or if the provider would like to make medication changes.   Follow-Up in: 3 MONTHS WITH DR. GARDENIA.  At the Advanced Heart Failure Clinic, you and your health needs are our priority. We have a designated team specialized in the treatment of Heart Failure. This Care Team includes your primary Heart Failure Specialized Cardiologist (physician), Advanced Practice Providers (APPs- Physician Assistants and Nurse Practitioners), and Pharmacist who all work together to provide you with the care you need, when you need it.   You may see any of the following providers on your designated Care Team at your next follow up:  Dr. Toribio Fuel Dr. Ezra Shuck Dr. Ria GARDENIA Dr. Odis Brownie Ellouise Class, FNP Jaun Bash, RPH-CPP  Please be sure to bring in all your medications bottles to every appointment.   Need to Contact Us :  If you have any questions or concerns before your next appointment please send us  a message through [place] or call our office at [phone].    TO LEAVE A MESSAGE FOR THE NURSE SELECT OPTION 2, PLEASE LEAVE A MESSAGE INCLUDING: YOUR NAME DATE OF BIRTH CALL BACK NUMBER REASON FOR CALL**this is important as we prioritize the call backs  YOU WILL RECEIVE A CALL BACK THE SAME DAY AS LONG AS YOU CALL BEFORE 4:00 PM

## 2023-08-28 ENCOUNTER — Other Ambulatory Visit (HOSPITAL_COMMUNITY): Payer: Self-pay

## 2023-08-29 ENCOUNTER — Other Ambulatory Visit (HOSPITAL_COMMUNITY): Payer: Self-pay

## 2023-09-03 ENCOUNTER — Telehealth: Payer: Self-pay | Admitting: Pharmacist

## 2023-09-03 ENCOUNTER — Other Ambulatory Visit (HOSPITAL_COMMUNITY): Payer: Self-pay

## 2023-09-03 NOTE — Telephone Encounter (Signed)
 Prior authorization for Zepbound submitted and approved, however, insurance coverage is abysmal and the cost of the drug remain >$1000 per month. Discussed with patient advocate and there are no other options for coverage given no diagnosis of T2DM. Attempted to call patient with no response.

## 2023-09-10 ENCOUNTER — Other Ambulatory Visit: Payer: Self-pay

## 2023-11-02 ENCOUNTER — Telehealth: Payer: Self-pay | Admitting: Cardiology

## 2023-11-02 NOTE — Telephone Encounter (Signed)
 Called to confirm/remind patient of their appointment at the Advanced Heart Failure Clinic on 11/02/23.   Appointment:   [] Confirmed  [x] Left mess   [] No answer/No voice mail  [] VM Full/unable to leave message  [] Phone not in service  Patient reminded to bring all medications and/or complete list.  Confirmed patient has transportation. Gave directions, instructed to utilize valet parking.

## 2023-11-05 ENCOUNTER — Encounter: Admitting: Cardiology

## 2023-11-05 ENCOUNTER — Telehealth: Payer: Self-pay | Admitting: Cardiology

## 2023-11-05 NOTE — Telephone Encounter (Signed)
 Called to confirm/remind patient of their appointment at the Advanced Heart Failure Clinic on 11/06/23.   Appointment:   [x] Confirmed  [] Left mess   [] No answer/No voice mail  [] VM Full/unable to leave message  [] Phone not in service  Patient reminded to bring all medications and/or complete list.  Confirmed patient has transportation. Gave directions, instructed to utilize valet parking.

## 2023-11-06 ENCOUNTER — Other Ambulatory Visit
Admission: RE | Admit: 2023-11-06 | Discharge: 2023-11-06 | Disposition: A | Discharge status: Home / Self Care | Source: Ambulatory Visit | Attending: Cardiology | Admitting: Cardiology

## 2023-11-06 ENCOUNTER — Ambulatory Visit (HOSPITAL_BASED_OUTPATIENT_CLINIC_OR_DEPARTMENT_OTHER): Admitting: Cardiology

## 2023-11-06 ENCOUNTER — Encounter: Payer: Self-pay | Admitting: Cardiology

## 2023-11-06 VITALS — BP 140/102 | HR 89 | Wt 253.0 lb

## 2023-11-06 DIAGNOSIS — Z6836 Body mass index (BMI) 36.0-36.9, adult: Secondary | ICD-10-CM | POA: Insufficient documentation

## 2023-11-06 DIAGNOSIS — I5020 Unspecified systolic (congestive) heart failure: Secondary | ICD-10-CM | POA: Insufficient documentation

## 2023-11-06 DIAGNOSIS — Z8616 Personal history of COVID-19: Secondary | ICD-10-CM | POA: Insufficient documentation

## 2023-11-06 DIAGNOSIS — Z79899 Other long term (current) drug therapy: Secondary | ICD-10-CM | POA: Diagnosis not present

## 2023-11-06 DIAGNOSIS — E66812 Obesity, class 2: Secondary | ICD-10-CM

## 2023-11-06 DIAGNOSIS — I110 Hypertensive heart disease with heart failure: Secondary | ICD-10-CM | POA: Insufficient documentation

## 2023-11-06 DIAGNOSIS — B349 Viral infection, unspecified: Secondary | ICD-10-CM | POA: Insufficient documentation

## 2023-11-06 DIAGNOSIS — E669 Obesity, unspecified: Secondary | ICD-10-CM | POA: Insufficient documentation

## 2023-11-06 DIAGNOSIS — Z7984 Long term (current) use of oral hypoglycemic drugs: Secondary | ICD-10-CM | POA: Diagnosis not present

## 2023-11-06 DIAGNOSIS — Z6835 Body mass index (BMI) 35.0-35.9, adult: Secondary | ICD-10-CM

## 2023-11-06 LAB — BASIC METABOLIC PANEL WITH GFR
Anion gap: 9 (ref 5–15)
BUN: 23 mg/dL — ABNORMAL HIGH (ref 6–20)
CO2: 24 mmol/L (ref 22–32)
Calcium: 9.1 mg/dL (ref 8.9–10.3)
Chloride: 108 mmol/L (ref 98–111)
Creatinine, Ser: 1.44 mg/dL — ABNORMAL HIGH (ref 0.61–1.24)
GFR, Estimated: >60 mL/min (ref >60)
Glucose, Bld: 99 mg/dL (ref 70–99)
Potassium: 4 mmol/L (ref 3.5–5.1)
Sodium: 141 mmol/L (ref 135–145)

## 2023-11-06 LAB — BRAIN NATRIURETIC PEPTIDE: B Natriuretic Peptide: 95.8 pg/mL (ref 0.0–100.0)

## 2023-11-06 MED ORDER — METOPROLOL SUCCINATE ER 100 MG PO TB24: 100.0000 mg | ORAL_TABLET | Freq: Every day | ORAL | 3 refills | Status: AC

## 2023-11-06 MED ORDER — SACUBITRIL-VALSARTAN 97-103 MG PO TABS: 1.0000 | ORAL_TABLET | Freq: Two times a day (BID) | ORAL | 6 refills | Status: AC

## 2023-11-06 NOTE — Patient Instructions (Signed)
 Medication Changes:  INCREASE Entresto  to 97/103mg  (1 tab) two times daily  INCREASE Metoprolol  100mg  (1 tab) daily  Lab Work:  Go over to the [facility]. Go pass the gift shop and have your blood work completed.  We will only call you if the results are abnormal or if the provider would like to make medication changes.  No news is good news.   Testing/Procedures:  Your physician has recommended that you have a cardiopulmonary stress test (CPX). CPX testing is a non-invasive measurement of heart and lung function. It replaces a traditional treadmill stress test. This type of test provides a tremendous amount of information that relates not only to your present condition but also for future outcomes. This test combines measurements of you ventilation, respiratory gas exchange in the lungs, electrocardiogram (EKG), blood pressure and physical response before, during, and following an exercise protocol.   Your physician has requested that you have an echocardiogram. Echocardiography is a painless test that uses sound waves to create images of your heart. It provides your doctor with information about the size and shape of your heart and how well your heart's chambers and valves are working. This procedure takes approximately one hour. There are no restrictions for this procedure. Please do NOT wear cologne, perfume, aftershave, or lotions (deodorant is allowed). Please arrive 15 minutes prior to your appointment time.  Please note: We ask at that you not bring children with you during ultrasound (echo/ vascular) testing. Due to room size and safety concerns, children are not allowed in the ultrasound rooms during exams. Our front office staff cannot provide observation of children in our lobby area while testing is being conducted. An adult accompanying a patient to their appointment will only be allowed in the ultrasound room at the discretion of the ultrasound technician under special  circumstances. We apologize for any inconvenience.   This will be done at [facility]. This office will contact you in order to schedule your appointment.    Follow-Up in: Please follow up with the [facility] in 8 weeks with Dr. Gardenia.   Thank you for choosing [facility].    At the [facility], you and your health needs are our priority. We have a designated team specialized in the treatment of Heart Failure. This Care Team includes your primary Heart Failure Specialized Cardiologist (physician), Advanced Practice Providers (APPs- Physician Assistants and Nurse Practitioners), and Pharmacist who all work together to provide you with the care you need, when you need it.   You may see any of the following providers on your designated Care Team at your next follow up:  Dr. Toribio Fuel Dr. Ezra Shuck Dr. Ria Gardenia Dr. Morene Brownie Ellouise Class, FNP Jaun Bash, RPH-CPP  Please be sure to bring in all your medications bottles to every appointment.   Need to Contact Us :  If you have any questions or concerns before your next appointment please send us  a message through [place] or call our office at [phone].    TO LEAVE A MESSAGE FOR THE NURSE SELECT OPTION 2, PLEASE LEAVE A MESSAGE INCLUDING: YOUR NAME DATE OF BIRTH CALL BACK NUMBER REASON FOR CALL**this is important as we prioritize the call backs  YOU WILL RECEIVE A CALL BACK THE SAME DAY AS LONG AS YOU CALL BEFORE 4:00 PM
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
## 2023-11-19 ENCOUNTER — Encounter (HOSPITAL_COMMUNITY)

## 2023-11-22 ENCOUNTER — Ambulatory Visit (HOSPITAL_COMMUNITY): Attending: Cardiology

## 2023-11-22 DIAGNOSIS — I5020 Unspecified systolic (congestive) heart failure: Secondary | ICD-10-CM

## 2023-11-29 ENCOUNTER — Other Ambulatory Visit: Payer: Self-pay | Admitting: Cardiology

## 2024-01-21 ENCOUNTER — Other Ambulatory Visit: Payer: Self-pay

## 2024-01-21 MED ORDER — TORSEMIDE 10 MG PO TABS: 10.0000 mg | ORAL_TABLET | Freq: Every day | ORAL | 3 refills | Status: AC

## 2024-03-04 ENCOUNTER — Telehealth: Payer: Self-pay | Admitting: Family

## 2024-03-04 NOTE — Telephone Encounter (Signed)
 Called to confirm/remind patient of their appointment at the [facility] Clinic on 03/05/24.   Appointment:   [x] Confirmed  [] Left mess   [] No answer/No voice mail  [] VM Full/unable to leave message  [] Phone not in service  Patient reminded to bring all medications and/or complete list.  Confirmed patient has transportation. Gave directions, instructed to utilize valet parking.
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
## 2024-03-05 ENCOUNTER — Ambulatory Visit: Admitting: Family

## 2024-03-27 ENCOUNTER — Other Ambulatory Visit: Payer: Self-pay

## 2024-03-27 MED ORDER — SPIRONOLACTONE 25 MG PO TABS: 25.0000 mg | ORAL_TABLET | Freq: Every day | ORAL | 0 refills | Status: AC

## 2024-04-01 ENCOUNTER — Telehealth: Payer: Self-pay | Admitting: Family
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
## 2024-04-01 NOTE — Telephone Encounter (Signed)
 Called to confirm/remind patient of their appointment at the [facility] on 04/02/24.   Appointment:   [x] Confirmed  [] Left mess   [] No answer/No voice mail  [] VM Full/unable to leave message  [] Phone not in service  Patient reminded to bring all medications and/or complete list.  Confirmed patient has transportation. Gave directions, instructed to utilize valet parking.

## 2024-04-02 ENCOUNTER — Encounter: Payer: Self-pay | Admitting: Family

## 2024-04-02 ENCOUNTER — Ambulatory Visit: Payer: Self-pay | Admitting: Family

## 2024-04-02 ENCOUNTER — Other Ambulatory Visit
Admission: RE | Admit: 2024-04-02 | Discharge: 2024-04-02 | Disposition: A | Source: Ambulatory Visit | Attending: Family | Admitting: Family

## 2024-04-02 ENCOUNTER — Ambulatory Visit: Admitting: Family

## 2024-04-02 VITALS — BP 102/84 | HR 74 | Ht 70.0 in | Wt 270.2 lb

## 2024-04-02 DIAGNOSIS — I10 Essential (primary) hypertension: Secondary | ICD-10-CM

## 2024-04-02 DIAGNOSIS — I5022 Chronic systolic (congestive) heart failure: Secondary | ICD-10-CM

## 2024-04-02 DIAGNOSIS — R0683 Snoring: Secondary | ICD-10-CM

## 2024-04-02 LAB — BASIC METABOLIC PANEL WITH GFR
Anion gap: 11 (ref 5–15)
BUN: 18 mg/dL (ref 6–20)
CO2: 26 mmol/L (ref 22–32)
Calcium: 9.2 mg/dL (ref 8.9–10.3)
Chloride: 104 mmol/L (ref 98–111)
Creatinine, Ser: 1.19 mg/dL (ref 0.61–1.24)
GFR, Estimated: >60 mL/min
Glucose, Bld: 94 mg/dL (ref 70–99)
Potassium: 4.1 mmol/L (ref 3.5–5.1)
Sodium: 141 mmol/L (ref 135–145)

## 2024-04-02 NOTE — Progress Notes (Signed)
 Height: 5'10    Weight: 270 BMI:  Today's Date: 04/02/24  STOP BANG RISK ASSESSMENT S (snore) Have you been told that you snore?     YES   T (tired) Are you often tired, fatigued, or sleepy during the day?   YES  O (obstruction) Do you stop breathing, choke, or gasp during sleep? YES   P (pressure) Do you have or are you being treated for high blood pressure? YES   B (BMI) Is your body index greater than 35 kg/m? YES   A (age) Are you 46 years old or older? NO   N (neck) Do you have a neck circumference greater than 16 inches?   YES   G (gender) Are you a male? YES   TOTAL STOP/BANG YES ANSWERS 7                                                                       For Office Use Only              Procedure Order Form    YES to 3+ Stop Bang questions OR two clinical symptoms - patient qualifies for WatchPAT (CPT 95800)      Clinical Notes: Will consult Sleep Specialist and refer for management of therapy due to patient increased risk of Sleep Apnea. Ordering a sleep study due to the following two clinical symptoms:Loud snoring R06.83 History of high blood pressure

## 2024-04-02 NOTE — Patient Instructions (Signed)
" °  Lab Work:  Labs done today, your results will be available in MyChart, we will contact you for abnormal readings.   Testing/Procedures:  Your provider has recommended that you have a home sleep study (Itamar Test).  We have provided you with the equipment in our office today. Please go ahead and download the app. Your PIN number is [id number]. DO NOT OPEN OR TAMPER WITH THE BOX UNTIL WE ADVISE YOU TO DO SO. We will call you after finding out authorization with your insurance.  Once you have completed the test you just dispose of the equipment, the information is automatically uploaded to us  via blue-tooth technology. If your test is positive for sleep apnea and you need a home CPAP machine you will be contacted by Dr Dorine office [facility]) to set this up.    Follow-Up in: Dr. Rolan May 13th 3:45 P.M.    If you have any questions or concerns before your next appointment please send us  a message through mychart or call our office at [phone], If it is after office hours your call will be answered by our answering service and directed appropriately.     At the [facility], you and your health needs are our priority. We have a designated team specialized in the treatment of Heart Failure. This Care Team includes your primary Heart Failure Specialized Cardiologist (physician), Advanced Practice Providers (APPs- Physician Assistants and Nurse Practitioners), and Pharmacist who all work together to provide you with the care you need, when you need it.   You may see any of the following providers on your designated Care Team at your next follow up:  Dr. Toribio Fuel Dr. Ezra Rolan Dr. Ria Commander Dr. Odis Brownie Greig Mosses, NP Caffie Shed, [place] [address] [place], [place] Beckey Coe, NP Jordan Lee, NP Ellouise Class, NP Jaun Bash, PharmD  "

## 2024-07-09 ENCOUNTER — Ambulatory Visit: Admitting: Cardiology

## 2024-07-25 ENCOUNTER — Ambulatory Visit: Admitting: Cardiology
